# Patient Record
Sex: Female | Born: 1955 | Race: Black or African American | Hispanic: No | Marital: Single | Smoking: Former smoker
Health system: Southern US, Community
[De-identification: ages and names within clinical notes are randomized; demographics above are authoritative.]

## PROBLEM LIST (undated history)

## (undated) DIAGNOSIS — M1990 Unspecified osteoarthritis, unspecified site: Secondary | ICD-10-CM

---

## 1983-08-12 HISTORY — PX: CYST EXCISION: SHX5701

## 1998-08-11 HISTORY — PX: DILATION AND CURETTAGE OF UTERUS: SHX78

## 1998-08-11 HISTORY — PX: TUBAL LIGATION: SHX77

## 1999-09-19 ENCOUNTER — Ambulatory Visit (HOSPITAL_COMMUNITY): Admission: RE | Admit: 1999-09-19 | Discharge: 1999-09-19 | Payer: Self-pay | Admitting: *Deleted

## 2002-12-01 ENCOUNTER — Encounter: Payer: Self-pay | Admitting: Emergency Medicine

## 2002-12-01 ENCOUNTER — Emergency Department (HOSPITAL_COMMUNITY): Admission: EM | Admit: 2002-12-01 | Discharge: 2002-12-01 | Payer: Self-pay | Admitting: Emergency Medicine

## 2003-01-30 ENCOUNTER — Encounter: Payer: Self-pay | Admitting: Obstetrics

## 2003-01-30 ENCOUNTER — Ambulatory Visit (HOSPITAL_COMMUNITY): Admission: RE | Admit: 2003-01-30 | Discharge: 2003-01-30 | Payer: Self-pay | Admitting: Obstetrics

## 2003-03-25 ENCOUNTER — Emergency Department (HOSPITAL_COMMUNITY): Admission: EM | Admit: 2003-03-25 | Discharge: 2003-03-25 | Payer: Self-pay | Admitting: Emergency Medicine

## 2005-06-27 ENCOUNTER — Ambulatory Visit (HOSPITAL_COMMUNITY): Admission: RE | Admit: 2005-06-27 | Discharge: 2005-06-27 | Payer: Self-pay | Admitting: Obstetrics

## 2005-07-02 ENCOUNTER — Encounter (INDEPENDENT_AMBULATORY_CARE_PROVIDER_SITE_OTHER): Payer: Self-pay | Admitting: Specialist

## 2005-07-02 ENCOUNTER — Ambulatory Visit (HOSPITAL_COMMUNITY): Admission: RE | Admit: 2005-07-02 | Discharge: 2005-07-02 | Payer: Self-pay | Admitting: Obstetrics

## 2006-12-13 ENCOUNTER — Emergency Department (HOSPITAL_COMMUNITY): Admission: EM | Admit: 2006-12-13 | Discharge: 2006-12-13 | Payer: Self-pay | Admitting: Emergency Medicine

## 2010-03-04 ENCOUNTER — Emergency Department (HOSPITAL_COMMUNITY): Admission: EM | Admit: 2010-03-04 | Discharge: 2010-03-04 | Payer: Self-pay | Admitting: Emergency Medicine

## 2010-08-31 ENCOUNTER — Encounter: Payer: Self-pay | Admitting: Obstetrics

## 2010-09-01 ENCOUNTER — Encounter: Payer: Self-pay | Admitting: Obstetrics

## 2010-12-27 NOTE — H&P (Signed)
[facility]  Patient:    Laura Huff, Laura Huff                         MRN: [id number] Adm. Date:  [id number] Attending:  Deniece Ree                         History and Physical  HISTORY:                          The patient is a 55 year old multiparous female who desires permanent sterilization.  The patient indicates that she has studied this for several years now; however, is now ready for permanent sterilization procedure.  She understands the different types of contraceptives available. The procedure and its benefits were explained to the patient as well as answering all of her questions to her satisfaction.  PAST MEDICAL HISTORY, FAMILY HISTORY, REVIEW OF SYSTEMS:  Noncontributory.  PHYSICAL EXAMINATION:  GENERAL:                          A well-developed, well-nourished female in no  acute distress.  HEENT:                            Within normal limits.  NECK:                             Supple.  BREASTS:                          Without masses, tenderness, or discharge.  LUNGS:                            Clear to percussion and auscultation.  HEART:                            Normal sinus rhythm without murmur, rub, or gallop.  ABDOMEN:                          Benign.  EXTREMITIES:                      Within normal limits.  NEUROLOGIC:                       Within normal limits.  PELVIC:                           External genitalia and BUS within normal limits. The vagina is clear.  Cervix firm and nontender.  The uterus is normal size, shape, and consistency.  The adnexa is benign.  DIAGNOSIS:                        Multiparity desirous of permanent sterilization.  PLAN:                             Laparoscopic bilateral tubular cauterization ith tubal resection. DD:  09/19/99 TD:  09/19/99 Job: [id number]

## 2010-12-27 NOTE — Op Note (Signed)
Laura Huff, Laura Huff                  ACCOUNT NO.:  [id number]   MEDICAL RECORD NO.:  [id number]          PATIENT TYPE:  AMB   LOCATION:  SDC                           FACILITY:  [facility]   PHYSICIAN:  Kathreen Cosier, M.D.DATE OF BIRTH:  1955/12/13   DATE OF PROCEDURE:  07/02/2005  DATE OF DISCHARGE:                                 OPERATIVE REPORT   PREOPERATIVE DIAGNOSIS:  DOB.   ANESTHESIA:  General.   Under general anesthesia, the patient was placed in lithotomy position, the  perineum and vagina prepped and draped, and bladder emptied with a straight  catheter.  Bimanual exam revealed the uterus to be small, negative adnexa.  Speculum placed in the vagina.  Cervix injected with 9 mL of 1% Xylocaine at  3, 9, and 12 o'clock.  The anterior lip of the cervix was grasped with a  tenaculum.  The endocervix curetted.  A small amount of tissue obtained.  Endometrial cavity sounded to 8 cm.  The cervix dilated with #27 Shawnie Pons and  then the hysteroscope inserted.  She was noted to have small polyps and  otherwise the cavity was normal.  Procedure terminated and a sharp curettage  performed.  Small amount of tissue obtained.  Polyp forceps inserted and no  tissue obtained.  The hysteroscope was reinserted and the cavity was noted  to be clean.  The patient tolerated the procedure well.  Fluid deficit was  10 mL.           ______________________________  Kathreen Cosier, M.D.     BAM/MEDQ  D:  07/02/2005  T:  07/02/2005  Job:  [id number]

## 2010-12-27 NOTE — Op Note (Signed)
[facility]  Patient:    Laura Huff, Laura Huff                         MRN: [id number] Proc. Date: 09/19/99 Adm. Date:  [id number] Attending:  Deniece Ree                           Operative Report  PREOPERATIVE DIAGNOSIS:  Multiparity, desire for permanent sterilization.  POSTOPERATIVE DIAGNOSIS:  Multiparity, desire for permanent sterilization.  OPERATION:  Laparoscopic bilateral tubal cauterization with tubal resection.  SURGEON:  Deniece Ree, M.D.  ANESTHESIA:  General.  ESTIMATED BLOOD LOSS:  Less than 25 cc.  COMPLICATIONS:  None.  The patient tolerated the procedure well and returned to the recovery room in satisfactory condition.  DESCRIPTION OF PROCEDURE:  The patient was taken to the operating room and prepped and draped in the usual fashion for laparoscopic surgery.  Speculum was placed n the vagina following which the anterior lip of the cervix was then grasped with a Christella Hartigan tenaculum.  The acorn uterine manipulating cannula was then put into place. A subumbilical incision was then made following which the Veress needle was introduced through this incision and approximately 3L of carbon dioxide was then infused without any difficulty.  The laparoscopic trocar was placed through this incision following which the laparoscope was placed through a sleeve. Visualization of the pelvic organ came into view.  Uterus, tubes and ovaries all appeared to be within normal limits.  The right tube was then grasped approximately 5 mm proximal to the right cornu and cauterized approximately 3 cm in length. This was done likewise on the left side.  After this was completed in the cauterized  areas of both tubes, they were cut into two locations.  Hemostasis remained present at this point.  The sponge and needle count was correct x 2.  At this point the  carbon dioxide was then allowed to escape which it did so without any problems.  The  incision was then closed first with a deep interrupted stitch followed by a  subcuticular stitch using 4-0 Vicryl.  Procedure terminated.  The patient tolerated the procedure well and returned to the recovery room in satisfactory condition.  The patient is to be discharged when fully alert.  She has been instructed on the possible complications that can follow this type of surgery.  She has been told to return to my office in four weeks for follow-up evaluation or to call me prior o that time should any problem arise. DD:  09/19/99 TD:  09/20/99 Job: [id number]

## 2010-12-27 NOTE — Consult Note (Signed)
NAME:  Laura Huff, Laura Huff                            ACCOUNT NO.:  [id number]   MEDICAL RECORD NO.:  [id number]                   PATIENT TYPE:  EMS   LOCATION:  MINO                                 FACILITY:  [facility]   PHYSICIAN:  Peter M. Swaziland, M.D.               DATE OF BIRTH:  [birth year]   DATE OF CONSULTATION:  12/01/2002  DATE OF DISCHARGE:                                   CONSULTATION   HISTORY OF PRESENT ILLNESS:  The patient is a pleasant 55 year old African-  American female, previous healthy, who presents with complaints of chest  pain and left arm numbness. The patient states she has been having  intermittent right precordial chest pain for the past 10 days.  It is  localized pain described as a twisting sensation or a stabbing pain like an  ice pick.  There is no relation to movement activity or off.  It typically  lasts 5 to 10 minutes but may occur several times a day.  It does seem to be  worse after she has been lifting weights.  Today she developed some left arm  numbness and became concerned so decided to come to the emergency room.   Currently, she is pain free.  Of note, she did start on an exercise program  in January.  Over the past three to four weeks, she started working on  [employer].  She has no history of hypertension, diabetes, or  hypercholesterolemia, and she is a nonsmoker.   PAST MEDICAL HISTORY:  Notable for osteoarthritis of the right hip.  She is  followed by Dr. Ethelene Hal.   ALLERGIES:  No known allergies.   MEDICATIONS:  Vicodin p.r.n. and Vioxx.   SOCIAL HISTORY:  The patient works as a Engineer, civil (consulting) for [employer].  She is married and has two children.  She denies tobacco or alcohol use.   FAMILY HISTORY:  Father had three myocardial infarctions and coronary artery  bypass surgery prior to dying.  One brother has hypertension and a bad  heart.   REVIEW OF SYSTEMS:  Otherwise negative.   PHYSICAL EXAMINATION:  GENERAL:  The patient  is a pleasant, young black  female in no apparent distress.  VITAL SIGNS:  Blood pressure 127/61, pulse 54 and regular.  She is afebrile.  HEENT:  Exam is unremarkable.  Pupils are equal, round, and reactive to  light and accommodation.  Extraocular movements are full.  Oropharynx is  clear.  NECK:  Supple without JVD, adenopathy, thyromegaly, or bruit.  LUNGS:  Clear.  CARDIAC:  Regular rate and rhythm without murmurs, rubs, gallops, or clicks.  There is some mild right pectoral pain to palpation.  ABDOMEN:  Soft, nontender.  Bowel sounds are positive.  EXTREMITIES:  Without edema.  Pulses are 2+ and symmetric.  NEUROLOGIC:  Nonfocal.   LABORATORY DATA:  ECG shows marked sinus bradycardia, otherwise normal.  Chest x-ray shows no active disease.   Sodium 136, potassium 3.9, chloride 103, CO2 27, BUN 16, glucose 106.  Hemoglobin 16.  CK 105 with 1 MB.  Troponin is 0.01.   IMPRESSION:  1. Atypical chest pain most consistent with musculoskeletal pain.  2. Family history of coronary disease.   PLAN:  Will discharge home from the emergency room.  Will avoid  weightlifting and recommend nonsteroidal anti-inflammatory therapy. Will  arrange a followup stress echocardiogram as an outpatient tomorrow.                                               Peter M. Swaziland, M.D.    PMJ/MEDQ  D:  12/01/2002  T:  12/03/2002  Job:  [id number]   cc:   Renaye Rakers, M.D.  [phone]  [place]  [place] [address]  Fax: [phone]

## 2011-03-26 ENCOUNTER — Inpatient Hospital Stay (INDEPENDENT_AMBULATORY_CARE_PROVIDER_SITE_OTHER)
Admission: RE | Admit: 2011-03-26 | Discharge: 2011-03-26 | Disposition: A | Discharge status: Home / Self Care | Payer: Self-pay | Source: Ambulatory Visit | Attending: Family Medicine | Admitting: Family Medicine

## 2011-03-26 DIAGNOSIS — K089 Disorder of teeth and supporting structures, unspecified: Secondary | ICD-10-CM

## 2011-04-22 ENCOUNTER — Encounter (HOSPITAL_COMMUNITY): Payer: Self-pay | Admitting: Radiology

## 2011-04-22 ENCOUNTER — Emergency Department (HOSPITAL_COMMUNITY)
Admission: EM | Admit: 2011-04-22 | Discharge: 2011-04-22 | Disposition: A | Discharge status: Home / Self Care | Payer: Self-pay | Attending: Emergency Medicine | Admitting: Emergency Medicine

## 2011-04-22 ENCOUNTER — Emergency Department (HOSPITAL_COMMUNITY): Payer: Self-pay

## 2011-04-22 DIAGNOSIS — R42 Dizziness and giddiness: Secondary | ICD-10-CM | POA: Insufficient documentation

## 2011-04-22 DIAGNOSIS — I498 Other specified cardiac arrhythmias: Secondary | ICD-10-CM | POA: Insufficient documentation

## 2011-04-22 DIAGNOSIS — H539 Unspecified visual disturbance: Secondary | ICD-10-CM | POA: Insufficient documentation

## 2011-04-22 DIAGNOSIS — R5381 Other malaise: Secondary | ICD-10-CM | POA: Insufficient documentation

## 2011-04-22 DIAGNOSIS — R5383 Other fatigue: Secondary | ICD-10-CM | POA: Insufficient documentation

## 2011-04-22 LAB — POCT I-STAT, CHEM 8
BUN: 7 mg/dL (ref 6–23)
Calcium, Ion: 1.24 mmol/L (ref 1.12–1.32)
Creatinine, Ser: 0.8 mg/dL (ref 0.50–1.10)
Glucose, Bld: 85 mg/dL (ref 70–99)
HCT: 42 % (ref 36.0–46.0)
Hemoglobin: 14.3 g/dL (ref 12.0–15.0)
Potassium: 4.7 mEq/L (ref 3.5–5.1)
Sodium: 140 mEq/L (ref 135–145)
TCO2: 29 mmol/L (ref 0–100)

## 2011-04-22 LAB — CBC
HCT: 38.2 % (ref 36.0–46.0)
MCH: 31.8 pg (ref 26.0–34.0)
MCHC: 33.8 g/dL (ref 30.0–36.0)
MCV: 94.1 fL (ref 78.0–100.0)
Platelets: 213 10*3/uL (ref 150–400)
RBC: 4.06 MIL/uL (ref 3.87–5.11)
RDW: 12.5 % (ref 11.5–15.5)
WBC: 3.7 10*3/uL — ABNORMAL LOW (ref 4.0–10.5)

## 2011-04-22 LAB — DIFFERENTIAL
Basophils Absolute: 0 10*3/uL (ref 0.0–0.1)
Basophils Relative: 1 % (ref 0–1)
Eosinophils Relative: 1 % (ref 0–5)
Lymphocytes Relative: 47 % — ABNORMAL HIGH (ref 12–46)
Lymphs Abs: 1.7 10*3/uL (ref 0.7–4.0)
Monocytes Absolute: 0.5 10*3/uL (ref 0.1–1.0)
Monocytes Relative: 14 % — ABNORMAL HIGH (ref 3–12)
Neutro Abs: 1.4 10*3/uL — ABNORMAL LOW (ref 1.7–7.7)
Neutrophils Relative %: 38 % — ABNORMAL LOW (ref 43–77)

## 2012-03-28 ENCOUNTER — Emergency Department (HOSPITAL_COMMUNITY)
Admission: EM | Admit: 2012-03-28 | Discharge: 2012-03-28 | Disposition: A | Discharge status: Home / Self Care | Payer: Self-pay | Source: Home / Self Care

## 2012-03-28 ENCOUNTER — Encounter (HOSPITAL_COMMUNITY): Payer: Self-pay

## 2012-03-28 DIAGNOSIS — J029 Acute pharyngitis, unspecified: Secondary | ICD-10-CM

## 2012-03-28 DIAGNOSIS — J069 Acute upper respiratory infection, unspecified: Secondary | ICD-10-CM

## 2012-03-28 LAB — POCT RAPID STREP A: Streptococcus, Group A Screen (Direct): NEGATIVE

## 2012-03-28 MED ORDER — CETIRIZINE-PSEUDOEPHEDRINE ER 5-120 MG PO TB12: 1.0000 | ORAL_TABLET | Freq: Two times a day (BID) | ORAL | Status: AC

## 2012-03-28 MED ORDER — GUAIFENESIN 100 MG/5ML PO LIQD: 100.0000 mg | ORAL | Status: AC | PRN

## 2012-03-28 MED ORDER — SALINE NASAL SPRAY 0.65 % NA SOLN: 1.0000 | NASAL | Status: DC | PRN

## 2012-03-28 NOTE — ED Notes (Signed)
Pt has sorethroat that started one week ago and worse at hs.

## 2012-03-28 NOTE — ED Provider Notes (Signed)
History     CSN: [id number]  Arrival date & time 03/28/12  1840   None     Chief Complaint  Patient presents with  . Sore Throat    (Consider location/radiation/quality/duration/timing/severity/associated sxs/prior treatment) The history is provided by the patient.  Laura Huff is a 56 y.o. female who complains of onset of sore throat and cough after sleeping under air conditioner one week ago. States symptoms are persistent and not improved.  Has not taken medication at home for same.  States concerned because she has a history of strep throat twenty years ago.   + sore throat + cough, non productive No pleuritic pain No wheezing + nasal congestion + post-nasal drainage No sinus pain/pressure + voice changes No chest congestion No itchy/red eyes No earache No hemoptysis No SOB + chills/sweats No fever No nausea No vomiting No abdominal pain No diarrhea No skin rashes No fatigue No myalgias No headache  No ill contacts   History reviewed. No pertinent past medical history.  History reviewed. No pertinent past surgical history.  History reviewed. No pertinent family history.  History  Substance Use Topics  . Smoking status: Never Smoker   . Smokeless tobacco: Not on file  . Alcohol Use: Yes    OB History    Grav Para Term Preterm Abortions TAB SAB Ect Mult Living                  Review of Systems  All other systems reviewed and are negative.    Allergies  Codeine and Shellfish allergy  Home Medications   Current Outpatient Rx  Name Route Sig Dispense Refill  . HYDROCODONE-ACETAMINOPHEN 10-325 MG PO TABS Oral Take 1 tablet by mouth every 6 (six) hours as needed.    Marland Kitchen CETIRIZINE-PSEUDOEPHEDRINE ER 5-120 MG PO TB12 Oral Take 1 tablet by mouth 2 (two) times daily. 30 tablet 0  . GUAIFENESIN 100 MG/5ML PO LIQD Oral Take 5-10 mLs (100-200 mg total) by mouth every 4 (four) hours as needed for cough. 60 mL 0  . SALINE NASAL SPRAY 0.65 % NA SOLN Nasal  Place 1 spray into the nose as needed for congestion. 30 mL 12    BP 143/77  Pulse 70  Temp 98.3 F (36.8 C) (Oral)  Resp 18  SpO2 100%  Physical Exam  Nursing note and vitals reviewed. Constitutional: She is oriented to person, place, and time. Vital signs are normal. She appears well-developed and well-nourished. She is active and cooperative.  HENT:  Head: Normocephalic.  Right Ear: External ear normal.  Left Ear: External ear normal.  Nose: Nose normal.  Mouth/Throat: Oropharynx is clear and moist. No oropharyngeal exudate.       Post nasal gtt noted  Eyes: Conjunctivae are normal. Pupils are equal, round, and reactive to light. No scleral icterus.  Neck: Trachea normal. Neck supple.  Cardiovascular: Normal rate, regular rhythm and normal heart sounds.   Pulmonary/Chest: Effort normal and breath sounds normal. No respiratory distress. She has no wheezes. She exhibits no tenderness.  Lymphadenopathy:    She has no cervical adenopathy.  Neurological: She is alert and oriented to person, place, and time. No cranial nerve deficit or sensory deficit.  Skin: Skin is warm and dry.  Psychiatric: She has a normal mood and affect. Her speech is normal and behavior is normal. Judgment and thought content normal. Cognition and memory are normal.    ED Course  Procedures (including critical care time)   Labs Reviewed  POCT RAPID STREP A ([facility]ARE ONLY)   No results found.   1. Pharyngitis   2. URI (upper respiratory infection)       MDM  Increase fluid intake, rest.  No antibiotic indicated.  Begin Mucinex, saline nasal spray and/or saline irrigation, and cough suppressant at bedtime. Antihistamines of your choice (Claritin or Zyrtec).  Tylenol or Motrin for fever/discomfort.  Followup with PCP if not improving 7 to 10 days.        Johnsie Kindred, NP 03/28/12 2007

## 2012-03-29 NOTE — ED Provider Notes (Signed)
Medical screening examination/treatment/procedure(s) were performed by non-physician practitioner and as supervising physician I was immediately available for consultation/collaboration.   MORENO-COLL,Ka Flammer; MD   Quorra Rosene Moreno-Coll, MD 03/29/12 1213 

## 2012-12-21 ENCOUNTER — Other Ambulatory Visit: Payer: Self-pay | Admitting: Pain Medicine

## 2012-12-21 DIAGNOSIS — M549 Dorsalgia, unspecified: Secondary | ICD-10-CM

## 2012-12-25 ENCOUNTER — Inpatient Hospital Stay: Admission: RE | Admit: 2012-12-25 | Payer: Self-pay | Source: Ambulatory Visit

## 2013-01-08 ENCOUNTER — Other Ambulatory Visit (HOSPITAL_COMMUNITY): Payer: Self-pay | Admitting: Pain Medicine

## 2013-01-08 ENCOUNTER — Ambulatory Visit (HOSPITAL_COMMUNITY)
Admission: RE | Admit: 2013-01-08 | Discharge: 2013-01-08 | Disposition: A | Discharge status: Home / Self Care | Payer: BC Managed Care – PPO | Source: Ambulatory Visit | Attending: Pain Medicine | Admitting: Pain Medicine

## 2013-01-08 ENCOUNTER — Ambulatory Visit
Admission: RE | Admit: 2013-01-08 | Discharge: 2013-01-08 | Disposition: A | Discharge status: Home / Self Care | Payer: BC Managed Care – PPO | Source: Ambulatory Visit | Attending: Pain Medicine | Admitting: Pain Medicine

## 2013-01-08 DIAGNOSIS — M549 Dorsalgia, unspecified: Secondary | ICD-10-CM

## 2013-01-08 DIAGNOSIS — M25552 Pain in left hip: Secondary | ICD-10-CM

## 2013-01-08 DIAGNOSIS — M545 Low back pain, unspecified: Secondary | ICD-10-CM | POA: Insufficient documentation

## 2013-01-08 DIAGNOSIS — M25559 Pain in unspecified hip: Secondary | ICD-10-CM | POA: Insufficient documentation

## 2013-01-08 DIAGNOSIS — M25551 Pain in right hip: Secondary | ICD-10-CM

## 2014-02-01 ENCOUNTER — Emergency Department (INDEPENDENT_AMBULATORY_CARE_PROVIDER_SITE_OTHER)
Admission: EM | Admit: 2014-02-01 | Discharge: 2014-02-01 | Disposition: A | Discharge status: Home / Self Care | Payer: PRIVATE HEALTH INSURANCE | Source: Home / Self Care | Attending: Emergency Medicine | Admitting: Emergency Medicine

## 2014-02-01 ENCOUNTER — Encounter (HOSPITAL_COMMUNITY): Payer: Self-pay | Admitting: Emergency Medicine

## 2014-02-01 DIAGNOSIS — M545 Low back pain, unspecified: Secondary | ICD-10-CM

## 2014-02-01 HISTORY — DX: Unspecified osteoarthritis, unspecified site: M19.90

## 2014-02-01 MED ORDER — HYDROCODONE-ACETAMINOPHEN 10-325 MG PO TABS: 1.0000 | ORAL_TABLET | Freq: Four times a day (QID) | ORAL | Status: DC | PRN

## 2014-02-01 NOTE — ED Notes (Addendum)
C/o low back pain and bil. hip pain started yesterday.  Has chronic pain from arthritis.  She goes to pain clinic. Her insurance changed and her new insurance did not cover the pain doctor she was seeing.  She is in the process of getting a new pain management doctor- Dr. Odette FractionPaul Harkins.  Her PCP referred her to Dr. Ollen BowlHarkins.  Has appt. @ 7/21.  Has run out of her pain medicine.  Her PCP would not prescribe it without seeing her.  No appt. available with PCP for 2 weeks. Requests refill of Hydrocodone.

## 2014-02-01 NOTE — ED Provider Notes (Signed)
CSN: [id number]     Arrival date & time 02/01/14  1716 History   First MD Initiated Contact with Patient 02/01/14 1826     Chief Complaint  Patient presents with  . Back Pain  . Hip Pain   (Consider location/radiation/quality/duration/timing/severity/associated sxs/prior Treatment) Patient is a 58 y.o. female presenting with back pain and hip pain. The history is provided by the patient. No language interpreter was used.  Back Pain Location:  Generalized Quality:  Aching Radiates to:  R thigh Pain severity:  Moderate Pain is:  Same all the time Onset quality:  Gradual Timing:  Constant Progression:  Unchanged Chronicity:  New Relieved by:  Nothing Worsened by:  Nothing tried Ineffective treatments:  None tried Hip Pain    Past Medical History  Diagnosis Date  . Arthritis    Past Surgical History  Procedure Laterality Date  . Tubal ligation  2000  . Cyst excision Right 1985    thought it was a cyst but it was a lymph gland-R neck  . Dilation and curettage of uterus  2000   Family History  Problem Relation Age of Onset  . Cancer Mother     cervical cancer  . Hypertension Father   . Heart failure Father    History  Substance Use Topics  . Smoking status: Never Smoker   . Smokeless tobacco: Not on file  . Alcohol Use: 0.6 oz/week    1 Cans of beer per week   OB History   Grav Para Term Preterm Abortions TAB SAB Ect Mult Living                 Review of Systems  Musculoskeletal: Positive for back pain.  All other systems reviewed and are negative.   Allergies  Shellfish allergy and Codeine  Home Medications   Prior to Admission medications   Medication Sig Start Date End Date Taking? Authorizing Provider  HYDROcodone-acetaminophen (NORCO) 10-325 MG per tablet Take 1 tablet by mouth every 6 (six) hours as needed.   Yes Historical Provider, MD  sodium chloride (OCEAN NASAL SPRAY) 0.65 % nasal spray Place 1 spray into the nose as needed for congestion.  03/28/12 03/28/13  Johnsie Kindredarmen L Chatten, NP   BP 137/84  Pulse 91  Temp(Src) 99.1 F (37.3 C) (Oral)  Resp 18  SpO2 99% Physical Exam  Nursing note and vitals reviewed. Constitutional: She is oriented to person, place, and time. She appears well-developed and well-nourished.  HENT:  Head: Normocephalic.  Eyes: EOM are normal. Pupils are equal, round, and reactive to light.  Neck: Normal range of motion.  Pulmonary/Chest: Effort normal.  Abdominal: She exhibits no distension.  Musculoskeletal:  Tender ls spine tender right hip  Neurological: She is alert and oriented to person, place, and time.  Psychiatric: She has a normal mood and affect.    ED Course  Procedures (including critical care time) Labs Review Labs Reviewed - No data to display  Imaging Review No results found.   MDM   1. Midline low back pain without sciatica    Pt has chronic pain.  Pt's insurance changed and her pain doctor will no longer see her.   Pt has appointment with a new doctor in July  i advised pt I will give 3 day supply.  We can not and will not manage pain medications at urgent care.   Elson AreasLeslie K Sofia, PA-C 02/01/14 1926  Lonia SkinnerLeslie K PeckhamSofia, PA-C 02/01/14 1928

## 2014-02-01 NOTE — Discharge Instructions (Signed)

## 2014-02-02 NOTE — ED Provider Notes (Signed)
Medical screening examination/treatment/procedure(s) were performed by a resident physician or non-physician practitioner and as the supervising physician I was immediately available for consultation/collaboration.  Evan Corey, MD    Evan S Corey, MD 02/02/14 0728 

## 2014-02-21 ENCOUNTER — Emergency Department (INDEPENDENT_AMBULATORY_CARE_PROVIDER_SITE_OTHER)
Admission: EM | Admit: 2014-02-21 | Discharge: 2014-02-21 | Disposition: A | Discharge status: Home / Self Care | Payer: PRIVATE HEALTH INSURANCE | Source: Home / Self Care | Attending: Emergency Medicine | Admitting: Emergency Medicine

## 2014-02-21 ENCOUNTER — Encounter (HOSPITAL_COMMUNITY): Payer: Self-pay | Admitting: Emergency Medicine

## 2014-02-21 DIAGNOSIS — M160 Bilateral primary osteoarthritis of hip: Secondary | ICD-10-CM

## 2014-02-21 DIAGNOSIS — F411 Generalized anxiety disorder: Secondary | ICD-10-CM

## 2014-02-21 DIAGNOSIS — F419 Anxiety disorder, unspecified: Secondary | ICD-10-CM

## 2014-02-21 DIAGNOSIS — M1610 Unilateral primary osteoarthritis, unspecified hip: Secondary | ICD-10-CM

## 2014-02-21 MED ORDER — CLONAZEPAM 2 MG PO TABS: 2.0000 mg | ORAL_TABLET | Freq: Two times a day (BID) | ORAL | Status: DC

## 2014-02-21 NOTE — ED Provider Notes (Signed)
  Chief Complaint    Chief Complaint  Patient presents with  . 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 [facility] and was referred to pain management. She was on very high dose of Vicodin per pain management. She was taking 2 of the 10/325 tablets 4 times a day. Then her insurance changed and she could no longer be seen by pain management or her primary care Dr. Geronimo RunningShe's been without the Vicodin for about 3 weeks. She stopped cold [place]. She went through withdrawal symptoms. She did not want to start it back again. Her pain is now manageable with ibuprofen. She was also getting clonazepam 2 mg at bedtime as needed basis. She would like to get this refilled. She still somewhat nervous and jittery, having gone through withdrawal. She denies any depression, homicidal or suicidal ideation, or use of alcohol or recreational drugs.  Review of Systems     Other than as noted above, the patient denies any of the following symptoms: Systemic:  No fevers or chills.   Musculoskeletal:  No joint pain, arthritis, back pain, or neck pain. Neurological:  No muscular weakness or paresthesias.  PMFSH    Past medical history, family history, social history, meds, and allergies were reviewed.    Physical Exam    Vital signs:  BP 175/76  Pulse 64  Temp(Src) 98.7 F (37.1 C) (Oral)  Resp 16  SpO2 100% Gen:  Alert and oriented times 3.  In no distress. Musculoskeletal: Exam of the hips reveals mild pain to palpation laterally and in the groin areas bilaterally. Both hips have a diminished range of motion with pain on movement. Straight leg raising was negative.  Otherwise, all joints had a full a ROM with no swelling, bruising or deformity.  No edema, pulses full. Extremities were warm and pink.  Capillary refill was brisk.  Skin:   Clear, warm and dry.  No rash. Neuro:  Alert and oriented times 3.  Muscle strength was normal.  Sensation was intact to light touch.   Assessment    The primary encounter diagnosis was Primary osteoarthritis of both hips. A diagnosis of Anxiety was also pertinent to this visit.  Plan   1.  Meds:  The following meds were prescribed:   Discharge Medication List as of 02/21/2014  7:56 PM    START taking these medications   Details  clonazePAM (KLONOPIN) 2 MG tablet Take 1 tablet (2 mg total) by mouth 2 (two) times daily., Starting 02/21/2014, Until Discontinued, Print        2.  Patient Education/Counseling:  The patient was given appropriate handouts, self care instructions, and instructed in symptomatic relief, including rest and activity, elevation, application of ice and compression.    3.  Follow up:  The patient was told to follow up here if no better in 3 to 4 days, or sooner if becoming worse in any way, and given some red flag symptoms such as worsening pain or new neurological symptoms which would prompt immediate return.  Follow up here as needed.     Reuben Likesavid C Devani Odonnel, MD 02/21/14 [phone]

## 2014-02-21 NOTE — ED Notes (Signed)
C/o anxiety States her nerves are bad, she can't sleep at night and she is restless  Would like a refill on clonazepam

## 2014-02-21 NOTE — Discharge Instructions (Signed)
Arthritis, Nonspecific °Arthritis is inflammation of a joint. This usually means pain, redness, warmth or swelling are present. One or more joints may be involved. There are a number of types of arthritis. Your caregiver may not be able to tell what type of arthritis you have right away. °CAUSES  °The most common cause of arthritis is the wear and tear on the joint (osteoarthritis). This causes damage to the cartilage, which can break down over time. The knees, hips, back and neck are most often affected by this type of arthritis. °Other types of arthritis and common causes of joint pain include: °· Sprains and other injuries near the joint. Sometimes minor sprains and injuries cause pain and swelling that develop hours later. °· Rheumatoid arthritis. This affects hands, feet and knees. It usually affects both sides of your body at the same time. It is often associated with chronic ailments, fever, weight loss and general weakness. °· Crystal arthritis. Gout and pseudo gout can cause occasional acute severe pain, redness and swelling in the foot, ankle, or knee. °· Infectious arthritis. Bacteria can get into a joint through a break in overlying skin. This can cause infection of the joint. Bacteria and viruses can also spread through the blood and affect your joints. °· Drug, infectious and allergy reactions. Sometimes joints can become mildly painful and slightly swollen with these types of illnesses. °SYMPTOMS  °· Pain is the main symptom. °· Your joint or joints can also be red, swollen and warm or hot to the touch. °· You may have a fever with certain types of arthritis, or even feel overall ill. °· The joint with arthritis will hurt with movement. Stiffness is present with some types of arthritis. °DIAGNOSIS  °Your caregiver will suspect arthritis based on your description of your symptoms and on your exam. Testing may be needed to find the type of arthritis: °· Blood and sometimes urine tests. °· X-ray tests  and sometimes CT or MRI scans. °· Removal of fluid from the joint (arthrocentesis) is done to check for bacteria, crystals or other causes. Your caregiver (or a specialist) will numb the area over the joint with a local anesthetic, and use a needle to remove joint fluid for examination. This procedure is only minimally uncomfortable. °· Even with these tests, your caregiver may not be able to tell what kind of arthritis you have. Consultation with a specialist (rheumatologist) may be helpful. °TREATMENT  °Your caregiver will discuss with you treatment specific to your type of arthritis. If the specific type cannot be determined, then the following general recommendations may apply. °Treatment of severe joint pain includes: °· Rest. °· Elevation. °· Anti-inflammatory medication (for example, ibuprofen) may be prescribed. Avoiding activities that cause increased pain. °· Only take over-the-counter or prescription medicines for pain and discomfort as recommended by your caregiver. °· Cold packs over an inflamed joint may be used for 10 to 15 minutes every hour. Hot packs sometimes feel better, but do not use overnight. Do not use hot packs if you are diabetic without your caregiver's permission. °· A cortisone shot into arthritic joints may help reduce pain and swelling. °· Any acute arthritis that gets worse over the next 1 to 2 days needs to be looked at to be sure there is no joint infection. °Long-term arthritis treatment involves modifying activities and lifestyle to reduce joint stress jarring. This can include weight loss. Also, exercise is needed to nourish the joint cartilage and remove waste. This helps keep the muscles   around the joint strong. °HOME CARE INSTRUCTIONS  °· Do not take aspirin to relieve pain if gout is suspected. This elevates uric acid levels. °· Only take over-the-counter or prescription medicines for pain, discomfort or fever as directed by your caregiver. °· Rest the joint as much as  possible. °· If your joint is swollen, keep it elevated. °· Use crutches if the painful joint is in your leg. °· Drinking plenty of fluids may help for certain types of arthritis. °· Follow your caregiver's dietary instructions. °· Try low-impact exercise such as: °¨ Swimming. °¨ Water aerobics. °¨ Biking. °¨ Walking. °· Morning stiffness is often relieved by a warm shower. °· Put your joints through regular range-of-motion. °SEEK MEDICAL CARE IF:  °· You do not feel better in 24 hours or are getting worse. °· You have side effects to medications, or are not getting better with treatment. °SEEK IMMEDIATE MEDICAL CARE IF:  °· You have a fever. °· You develop severe joint pain, swelling or redness. °· Many joints are involved and become painful and swollen. °· There is severe back pain and/or leg weakness. °· You have loss of bowel or bladder control. °Document Released: 09/04/2004 Document Revised: 10/20/2011 Document Reviewed: 09/20/2008 °ExitCare® Patient Information ©2015 [employer]. This information is not intended to replace advice given to you by your health care provider. Make sure you discuss any questions you have with your health care provider. ° °

## 2014-03-27 ENCOUNTER — Encounter: Payer: Self-pay | Admitting: Medical

## 2014-03-27 ENCOUNTER — Ambulatory Visit (INDEPENDENT_AMBULATORY_CARE_PROVIDER_SITE_OTHER): Payer: Self-pay | Admitting: Medical

## 2014-03-27 VITALS — BP 126/80 | HR 72 | Temp 90.0°F | Resp 16 | Ht 65.25 in | Wt 166.0 lb

## 2014-03-27 DIAGNOSIS — G4700 Insomnia, unspecified: Secondary | ICD-10-CM

## 2014-03-27 DIAGNOSIS — G8929 Other chronic pain: Secondary | ICD-10-CM

## 2014-03-27 DIAGNOSIS — F411 Generalized anxiety disorder: Secondary | ICD-10-CM

## 2014-03-27 DIAGNOSIS — M25559 Pain in unspecified hip: Secondary | ICD-10-CM

## 2014-03-27 DIAGNOSIS — M549 Dorsalgia, unspecified: Secondary | ICD-10-CM

## 2014-03-27 NOTE — Progress Notes (Signed)
Subjective:   Laura Huff is a 58 y.o. female presenting on 03/27/2014 with new pt  New patient today.  Ended up here due to insurance change.  She notes a hx/o hip pains and back pains for many years.  Has had steroid injection in hip, multiple in the past, has had lumbar facet injections in low back in the past [facility]  Was seeing Dr. Jordan Likes for the past year, priro to that was seeing Dr. Ethelene Hal for many years.   Had been getting 10/325mg  hydrocodone 2 tablets QID, #240 per month through pain clinic.  But after insurance change, can't see Dr. Cyndia Diver clinic at this point.  6 wk ago stopped the medication cold [place] for a week, had flu like symptoms.   After 4 weeks, started aching in both hips again. Doesn't feel she needs 8 tablets daily, but feels like she needs 3-4 hydrocodone daily for pain.   Works for [employer], but [employer].  Applied for [employer], bought a policy, but missed a payment and there was some discrepancy at the front office today regarding insurance.   So currently is not insured.    Recently been having anxiety problems , nervous, anxious, saw Dr. Lorenz Coaster, [facility].  Was advised to get appt with primary care.  In the past had gotten clonazepam for sleep from Dr. Ethelene Hal.     She denies prior back surgery, denies prior physical therapy referral, is not up-to-date with colonoscopy and mammogram and Pap, has never had a colonoscopy.  Regarding anxiety no prior treatment for diagnosis of this other than recent prescription for 2 mg clonazepam.  She is a Designer, jewellery.  Lives at home with husband.      No other aggravating or relieving factors.  No other complaint.  Review of Systems ROS as in subjective      Objective:    BP 126/80  Pulse 72  Temp(Src) 90 F (32.2 C) (Oral)  Resp 16  Ht 5' 5.25" (1.657 m)  Wt 166 lb (75.297 kg)  BMI 27.42 kg/m2  General appearance: alert, no distress, WD/WN,  AA female Neck: supple, no lymphadenopathy, no thyromegaly, no masses, nontender Heart: RRR, normal S1, S2, no murmurs Lungs: CTA bilaterally, no wheezes, rhonchi, or rales Abdomen: +bs, soft, non tender, non distended, no masses, no hepatomegaly, no splenomegaly Back: mild lumbar paraspinal and midline tennderss, mild pain with ROM which is relatively full Pulses: 2+ symmetric, upper and lower extremities, normal cap refill Neuro: Her leg strength seems to be 4-5 out of 5 worse on the left in general, DTRs 1-2+ bilateral lower extremity, normal heel and toe walk, straight leg raising is positive at 20 both legs, otherwise nonfocal exam Msk: Bilateral hip external and internal range of motion seems reduced and with pain, otherwise legs nontender normal range of motion      Assessment: Encounter Diagnoses  Name Primary?  . Chronic back pain Yes  . Chronic hip pain, unspecified laterality   . Generalized anxiety disorder   . Insomnia      Plan: We discussed her concerns, her history of chronic pains, prior medications. I did review the hip x-rays and lumbar MRI there in the chart in 2014.  I have no other records other than the pill bottles she brought in.  I advise that we are not a pain clinic nor do we routinely manage chronic narcotic use.   Unfortunately she  has had an insurance change which led her here.  I did offer other medications to at least give her some relief pending records she declines NSAID, Ultram, Cymbalta, or other medication at this time. She notes that only hydrocodone helps her pain and she was not willing to start an SSRI or other medication.  Advised that she would have to be willing and open to other treatment recommendations for me to work with her.  At this point we will request prior records from [facility] and Dr. Jordan LikesSpivey with the pain clinic. No medications were prescribed today.  She will likely need to check with her insurance  coverage which may actually be in effect and find out where we can refer for pain clinic.  [name] was seen today for new pt.  Diagnoses and associated orders for this visit:  Chronic back pain  Chronic hip pain, unspecified laterality  Generalized anxiety disorder  Insomnia    Return pending records.

## 2014-03-28 ENCOUNTER — Telehealth: Payer: Self-pay | Admitting: Medical

## 2014-03-28 NOTE — Telephone Encounter (Signed)
lm

## 2014-04-25 ENCOUNTER — Telehealth (HOSPITAL_COMMUNITY): Payer: Self-pay | Admitting: *Deleted

## 2014-04-25 NOTE — ED Notes (Signed)
Pt. called and asked if I would ask Dr. Lorenz Coaster for a refill of her Hydrocodone and Clonazepam.  She said she was seen at the [facility] clinic but they did not give her any medication because she does not have insurance.  I told her we are not primary care and do not do refills or chronic pain management.  I told her she can go to the [facility] and get signed up there.  They do primary care for pt.'s that don't have insurance. Laura Huff 04/25/2014

## 2014-04-28 ENCOUNTER — Encounter (HOSPITAL_COMMUNITY): Payer: Self-pay | Admitting: Emergency Medicine

## 2014-04-28 ENCOUNTER — Emergency Department (INDEPENDENT_AMBULATORY_CARE_PROVIDER_SITE_OTHER)
Admission: EM | Admit: 2014-04-28 | Discharge: 2014-04-28 | Disposition: A | Discharge status: Home / Self Care | Payer: Self-pay | Source: Home / Self Care | Attending: Family Medicine | Admitting: Family Medicine

## 2014-04-28 DIAGNOSIS — F411 Generalized anxiety disorder: Secondary | ICD-10-CM

## 2014-04-28 DIAGNOSIS — M150 Primary generalized (osteo)arthritis: Principal | ICD-10-CM

## 2014-04-28 DIAGNOSIS — M8949 Other hypertrophic osteoarthropathy, multiple sites: Secondary | ICD-10-CM

## 2014-04-28 DIAGNOSIS — M159 Polyosteoarthritis, unspecified: Secondary | ICD-10-CM

## 2014-04-28 DIAGNOSIS — F419 Anxiety disorder, unspecified: Secondary | ICD-10-CM

## 2014-04-28 MED ORDER — CLONAZEPAM 0.5 MG PO TABS: 0.5000 mg | ORAL_TABLET | Freq: Two times a day (BID) | ORAL | Status: DC | PRN

## 2014-04-28 MED ORDER — MELOXICAM 7.5 MG PO TABS: 7.5000 mg | ORAL_TABLET | Freq: Every day | ORAL | Status: DC

## 2014-04-28 MED ORDER — HYDROCODONE-ACETAMINOPHEN 5-325 MG PO TABS: 1.0000 | ORAL_TABLET | ORAL | Status: DC | PRN

## 2014-04-28 MED ORDER — TRAMADOL HCL 50 MG PO TABS: 50.0000 mg | ORAL_TABLET | Freq: Four times a day (QID) | ORAL | Status: DC | PRN

## 2014-04-28 NOTE — ED Notes (Signed)
Patient c/o bilateral hip pain worse on the left. This is a chronic problem. Patient reports she has been experiencing a lot of pain keeping her up at night. Patient states she does not have health insurance. Patient is alert and oriented and in no acute distress.

## 2014-04-28 NOTE — Discharge Instructions (Signed)
You will need to arrange follow up with a primary care MD,  your orthopedists and pain management physician for further management of your chronic pain and anxiety. Do not take Hydrocodone and Tramadol together.  Osteoarthritis Osteoarthritis is a disease that causes soreness and inflammation of a joint. It occurs when the cartilage at the affected joint wears down. Cartilage acts as a cushion, covering the ends of bones where they meet to form a joint. Osteoarthritis is the most common form of arthritis. It often occurs in older people. The joints affected most often by this condition include those in the:  Ends of the fingers.  Thumbs.  Neck.  Lower back.  Knees.  Hips. CAUSES  Over time, the cartilage that covers the ends of bones begins to wear away. This causes bone to rub on bone, producing pain and stiffness in the affected joints.  RISK FACTORS Certain factors can increase your chances of having osteoarthritis, including:  Older age.  Excessive body weight.  Overuse of joints.  Previous joint injury. SIGNS AND SYMPTOMS   Pain, swelling, and stiffness in the joint.  Over time, the joint may lose its normal shape.  Small deposits of bone (osteophytes) may grow on the edges of the joint.  Bits of bone or cartilage can break off and float inside the joint space. This may cause more pain and damage. DIAGNOSIS  Your health care provider will do a physical exam and ask about your symptoms. Various tests may be ordered, such as:  X-rays of the affected joint.  An MRI scan.  Blood tests to rule out other types of arthritis.  Joint fluid tests. This involves using a needle to draw fluid from the joint and examining the fluid under a microscope. TREATMENT  Goals of treatment are to control pain and improve joint function. Treatment plans may include:  A prescribed exercise program that allows for rest and joint relief.  A weight control plan.  Pain relief techniques,  such as:  Properly applied heat and cold.  Electric pulses delivered to nerve endings under the skin (transcutaneous electrical nerve stimulation [TENS]).  Massage.  Certain nutritional supplements.  Medicines to control pain, such as:  Acetaminophen.  Nonsteroidal anti-inflammatory drugs (NSAIDs), such as naproxen.  Narcotic or central-acting agents, such as tramadol.  Corticosteroids. These can be given orally or as an injection.  Surgery to reposition the bones and relieve pain (osteotomy) or to remove loose pieces of bone and cartilage. Joint replacement may be needed in advanced states of osteoarthritis. HOME CARE INSTRUCTIONS   Take medicines only as directed by your health care provider.  Maintain a healthy weight. Follow your health care provider's instructions for weight control. This may include dietary instructions.  Exercise as directed. Your health care provider can recommend specific types of exercise. These may include:  Strengthening exercises. These are done to strengthen the muscles that support joints affected by arthritis. They can be performed with weights or with exercise bands to add resistance.  Aerobic activities. These are exercises, such as brisk walking or low-impact aerobics, that get your heart pumping.  Range-of-motion activities. These keep your joints limber.  Balance and agility exercises. These help you maintain daily living skills.  Rest your affected joints as directed by your health care provider.  Keep all follow-up visits as directed by your health care provider. SEEK MEDICAL CARE IF:   Your skin turns red.  You develop a rash in addition to your joint pain.  You have  worsening joint pain.  You have a fever along with joint or muscle aches. SEEK IMMEDIATE MEDICAL CARE IF:  You have a significant loss of weight or appetite.  You have night sweats. FOR MORE INFORMATION   National Institute of Arthritis and Musculoskeletal  and Skin Diseases: www.niams.http://www.myers.net/  [employer] on Aging: https://walker.com/  American College of Rheumatology: www.rheumatology.org Document Released: 07/28/2005 Document Revised: 12/12/2013 Document Reviewed: 04/04/2013 [facility] Patient Information 2015 [place], [place]. This information is not intended to replace advice given to you by your health care provider. Make sure you discuss any questions you have with your health care provider.  Arthritis, Nonspecific Arthritis is pain, redness, warmth, or puffiness (inflammation) of a joint. The joint may be stiff or hurt when you move it. One or more joints may be affected. There are many types of arthritis. Your doctor may not know what type you have right away. The most common cause of arthritis is wear and tear on the joint (osteoarthritis). HOME CARE   Only take medicine as told by your doctor.  Rest the joint as much as possible.  Raise (elevate) your joint if it is puffy.  Use crutches if the painful joint is in your leg.  Drink enough fluids to keep your pee (urine) clear or pale yellow.  Follow your doctor's diet instructions.  Use cold packs for very bad joint pain for 10 to 15 minutes every hour. Ask your doctor if it is okay for you to use hot packs.  Exercise as told by your doctor.  Take a warm shower if you have stiffness in the morning.  Move your sore joints throughout the day. GET HELP RIGHT AWAY IF:   You have a fever.  You have very bad joint pain, puffiness, or redness.  You have many joints that are painful and puffy.  You are not getting better with treatment.  You have very bad back pain or leg weakness.  You cannot control when you poop (bowel movement) or pee (urinate).  You do not feel better in 24 hours or are getting worse.  You are having side effects from your medicine. MAKE SURE YOU:   Understand these instructions.  Will watch your condition.  Will get help right away if you are  not doing well or get worse. Document Released: 10/22/2009 Document Revised: 01/27/2012 Document Reviewed: 10/22/2009 [facility] Patient Information 2015 [place], [place]. This information is not intended to replace advice given to you by your health care provider. Make sure you discuss any questions you have with your health care provider.

## 2014-04-29 NOTE — ED Provider Notes (Signed)
Medical screening examination/treatment/procedure(s) were performed by resident physician or non-physician practitioner and as supervising physician I was immediately available for consultation/collaboration.   KINDL,JAMES DOUGLAS MD.   James D Kindl, MD 04/29/14 1047 

## 2014-04-29 NOTE — ED Provider Notes (Signed)
CSN: [id number]     Arrival date & time 04/28/14  1634 History   First MD Initiated Contact with Patient 04/28/14 1752     Chief Complaint  Patient presents with  . Hip Pain    Patient is a 58 y.o. female presenting with hip pain. The history is provided by the patient.  Hip Pain This is a chronic problem. The current episode started 2 days ago. The problem occurs constantly. The problem has been gradually worsening. Pertinent negatives include no chest pain, no abdominal pain, no headaches and no shortness of breath. The symptoms are aggravated by walking and bending.  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 She's been to [facility] orthopedics (Dr Ethelene Hal) and was referred to pain management (Dr Jordan Likes). She was on very high dose of Vicodin per pain management. She was taking 2 of the 10/325 tablets 4 times a day. She has also had multiple steroid injections as well. Then her insurance changed and she could no longer be seen by pain management or her primary care Dr. Geronimo Running been without the Vicodin since June. 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 She is also requesting a refill for her clonazepam that she takes for sleep and anxiety. She states she will enroll in [facility] in November and should be able to return to her PCP, ortho and pain management MD. She is requesting refills for these meds. She reports no new symptoms.   Past Medical History  Diagnosis Date  . Arthritis    Past Surgical History  Procedure Laterality Date  . Tubal ligation  2000  . Cyst excision Right 1985    thought it was a cyst but it was a lymph gland-R neck  . Dilation and curettage of uterus  2000   Family History    Problem Relation Age of Onset  . Cancer Mother     cervical cancer  . Hypertension Father   . Heart failure Father    History  Substance Use Topics  . Smoking status: Never Smoker   . Smokeless tobacco: Not on file  . Alcohol Use: 0.6 oz/week    1 Cans of beer per week   OB History   Grav Para Term Preterm Abortions TAB SAB Ect Mult Living                 Review of Systems  Respiratory: Negative for shortness of breath.   Cardiovascular: Negative for chest pain.  Gastrointestinal: Negative for abdominal pain.  Neurological: Negative for headaches.  All other systems reviewed and are negative.   Allergies  Shellfish allergy and Codeine  Home Medications   Prior to Admission medications   Medication Sig Start Date End Date Taking? Authorizing Provider  clonazePAM (KLONOPIN) 0.5 MG tablet Take 1 tablet (0.5 mg total) by mouth 2 (two) times daily as needed for anxiety. 04/28/14   Roma Kayser Schorr, NP  clonazePAM (KLONOPIN) 2 MG tablet Take 1 tablet (2 mg total) by mouth 2 (two) times daily. 02/21/14   Reuben Likes, MD  HYDROcodone-acetaminophen [facility]) 10-325 MG per tablet Take 2 tablets by mouth 4 (four) times daily.  02/01/14   Elson Areas, PA-C  HYDROcodone-acetaminophen (NORCO/VICODIN) 5-325 MG per tablet Take 1 tablet by mouth every 4 (four) hours as needed for moderate pain or severe pain. 04/28/14   Roma Kayser Schorr, NP  meloxicam (MOBIC) 7.5 MG tablet Take 1 tablet (7.5 mg total) by mouth daily. 04/28/14   Roma Kayser Schorr, NP  sodium chloride (OCEAN NASAL SPRAY) 0.65 % nasal spray Place 1 spray into the nose as needed for congestion. 03/28/12 03/28/13  Johnsie Kindred, NP  traMADol (ULTRAM) 50 MG tablet Take 1 tablet (50 mg total) by mouth every 6 (six) hours as needed for moderate pain or severe pain. 04/28/14   Roma Kayser Schorr, NP   BP 150/84  Pulse 98  Temp(Src) 99.2 F (37.3 C) (Oral)  Resp 12  SpO2 96% Physical Exam  Nursing note and vitals  reviewed. Constitutional: She is oriented to person, place, and time. She appears well-developed and well-nourished.  HENT:  Head: Normocephalic and atraumatic.  Eyes: Conjunctivae are normal.  Neck: Neck supple.  Cardiovascular: Normal rate.   Musculoskeletal:       Legs: Neurological: She is alert and oriented to person, place, and time. She has normal reflexes.  Skin: Skin is warm and dry.  Psychiatric: She has a normal mood and affect.    ED Course  Procedures (including critical care time) Labs Review Labs Reviewed - No data to display  Imaging Review No results found.   MDM   1. Primary osteoarthritis involving multiple joints   2. Anxiety    Pt w/ known h/o osteoarthritis and anxiety who has been under tx of PCP, ortho MD and pain  Management specialists until she lost insurance. Requesting refills for Klonopin and Hydrocodone. Reviewed the narcotic data-base and pt has not rec'd any sceduled medications since 01/2014. Discussed with pt that we can not continue to manage her chronic pain and anxiety issues as ideally this should be managed by a pain specialists and PCP respectively. Offered limited amounts of hydrocodone and Klonopin and added Tramadol and Mobic  as well. Pt verbalizes understanding and states she will make every effort to make these meds last until Nov.     Roma Kayser Schorr, NP 04/29/14 [phone]

## 2014-05-09 NOTE — ED Notes (Signed)
Pt. called and asked for refills of Klonazapam, Hydrocodone and Mobic.  I told her we are not primary care and don't do refills.  She has no insurance and Dr. Ethelene Halamos won't see her until she gets it. The open enrollment is in Nov.  I told her [facility] can [facility] [address] or come back here.

## 2014-05-11 ENCOUNTER — Emergency Department (INDEPENDENT_AMBULATORY_CARE_PROVIDER_SITE_OTHER)
Admission: EM | Admit: 2014-05-11 | Discharge: 2014-05-11 | Disposition: A | Discharge status: Home / Self Care | Payer: Self-pay | Source: Home / Self Care

## 2014-05-11 ENCOUNTER — Encounter (HOSPITAL_COMMUNITY): Payer: Self-pay | Admitting: Emergency Medicine

## 2014-05-11 DIAGNOSIS — H119 Unspecified disorder of conjunctiva: Secondary | ICD-10-CM

## 2014-05-11 DIAGNOSIS — G894 Chronic pain syndrome: Secondary | ICD-10-CM

## 2014-05-11 MED ORDER — TOBRAMYCIN 0.3 % OP SOLN: 1.0000 [drp] | Freq: Four times a day (QID) | OPHTHALMIC | Status: DC

## 2014-05-11 MED ORDER — IPRATROPIUM BROMIDE 0.06 % NA SOLN
2.0000 | Freq: Four times a day (QID) | NASAL | Status: DC
Start: 2014-05-11 — End: 2016-06-05

## 2014-05-11 NOTE — ED Provider Notes (Signed)
CSN: [id number]     Arrival date & time 05/11/14  1726 History   None    Chief Complaint  Patient presents with  . Conjunctivitis    left eye  . URI   (Consider location/radiation/quality/duration/timing/severity/associated sxs/prior Treatment) Patient is a 58 y.o. female presenting with conjunctivitis. The history is provided by the patient.  Conjunctivitis This is a new problem. The current episode started 2 days ago. The problem has not changed since onset.   Past Medical History  Diagnosis Date  . Arthritis    Past Surgical History  Procedure Laterality Date  . Tubal ligation  2000  . Cyst excision Right 1985    thought it was a cyst but it was a lymph gland-R neck  . Dilation and curettage of uterus  2000   Family History  Problem Relation Age of Onset  . Cancer Mother     cervical cancer  . Hypertension Father   . Heart failure Father    History  Substance Use Topics  . Smoking status: Never Smoker   . Smokeless tobacco: Not on file  . Alcohol Use: 0.6 oz/week    1 Cans of beer per week   OB History   Grav Para Term Preterm Abortions TAB SAB Ect Mult Living                 Review of Systems  Constitutional: Negative.   HENT: Positive for congestion and postnasal drip.   Eyes: Positive for discharge and redness.    Allergies  Shellfish allergy and Codeine  Home Medications   Prior to Admission medications   Medication Sig Start Date End Date Taking? Authorizing Provider  clonazePAM (KLONOPIN) 0.5 MG tablet Take 1 tablet (0.5 mg total) by mouth 2 (two) times daily as needed for anxiety. 04/28/14  Yes Roma KayserKatherine P Schorr, NP  HYDROcodone-acetaminophen (NORCO) 10-325 MG per tablet Take 2 tablets by mouth 4 (four) times daily. 02/01/14  Yes Lonia SkinnerLeslie K Sofia, PA-C  clonazePAM (KLONOPIN) 2 MG tablet Take 1 tablet (2 mg total) by mouth 2 (two) times daily. 02/21/14   Reuben Likesavid C Keller, MD  HYDROcodone-acetaminophen (NORCO/VICODIN) 5-325 MG per tablet Take 1 tablet by  mouth every 4 (four) hours as needed for moderate pain or severe pain. 04/28/14   Roma KayserKatherine P Schorr, NP  ipratropium (ATROVENT) 0.06 % nasal spray Place 2 sprays into both nostrils 4 (four) times daily. 05/11/14   Linna HoffJames D Garlan Drewes, MD  meloxicam (MOBIC) 7.5 MG tablet Take 1 tablet (7.5 mg total) by mouth daily. 04/28/14   Roma KayserKatherine P Schorr, NP  sodium chloride (OCEAN NASAL SPRAY) 0.65 % nasal spray Place 1 spray into the nose as needed for congestion. 03/28/12 03/28/13  Johnsie Kindredarmen L Chatten, NP  tobramycin (TOBREX) 0.3 % ophthalmic solution Place 1 drop into the left eye every 6 (six) hours. 05/11/14   Linna HoffJames D Junior Huezo, MD  traMADol (ULTRAM) 50 MG tablet Take 1 tablet (50 mg total) by mouth every 6 (six) hours as needed for moderate pain or severe pain. 04/28/14   Roma KayserKatherine P Schorr, NP   BP 127/84  Pulse 68  Temp(Src) 98.8 F (37.1 C) (Oral)  Resp 14  SpO2 98% Physical Exam  Nursing note and vitals reviewed. Constitutional: She is oriented to person, place, and time. She appears well-developed and well-nourished. No distress.  HENT:  Right Ear: External ear normal.  Left Ear: External ear normal.  Mouth/Throat: Oropharynx is clear and moist.  Eyes: Conjunctivae and EOM are normal.  Pupils are equal, round, and reactive to light. Left eye exhibits no discharge.  Neck: Normal range of motion. Neck supple.  Lymphadenopathy:    She has no cervical adenopathy.  Neurological: She is alert and oriented to person, place, and time.  Skin: Skin is warm and dry.    ED Course  Procedures (including critical care time) Labs Review Labs Reviewed - No data to display  Imaging Review No results found.   MDM   1. Conjunctiva disorder   2. Chronic pain disorder    Advised that no narcotics would be prescribed as she needs to see her chronic pain management team.    Linna Hoff, MD 05/11/14 1836

## 2014-05-11 NOTE — ED Notes (Signed)
C/o  Redness/irritation of left eye.  Scratchy throat.  Reports cold symptoms x 2 wks.  No relief with otc meds.   Pt also requesting refill on pain and anxiety meds.  Pt states that just went full time with job want have insurance until the first of november.

## 2014-07-01 ENCOUNTER — Encounter (HOSPITAL_COMMUNITY): Payer: Self-pay | Admitting: *Deleted

## 2014-07-01 ENCOUNTER — Emergency Department (INDEPENDENT_AMBULATORY_CARE_PROVIDER_SITE_OTHER)
Admission: EM | Admit: 2014-07-01 | Discharge: 2014-07-01 | Disposition: A | Discharge status: Home / Self Care | Payer: Self-pay | Source: Home / Self Care | Attending: Emergency Medicine | Admitting: Emergency Medicine

## 2014-07-01 DIAGNOSIS — M1612 Unilateral primary osteoarthritis, left hip: Secondary | ICD-10-CM

## 2014-07-01 MED ORDER — KETOROLAC TROMETHAMINE 60 MG/2ML IM SOLN
60.0000 mg | Freq: Once | INTRAMUSCULAR | Status: AC
  Administered 2014-07-01: 60 mg via INTRAMUSCULAR

## 2014-07-01 MED ORDER — HYDROCODONE-ACETAMINOPHEN 5-325 MG PO TABS: 1.0000 | ORAL_TABLET | Freq: Four times a day (QID) | ORAL | Status: DC | PRN

## 2014-07-01 MED ORDER — HYDROCODONE-ACETAMINOPHEN 5-325 MG PO TABS: 1.0000 | ORAL_TABLET | ORAL | Status: DC | PRN

## 2014-07-01 MED ORDER — METHYLPREDNISOLONE (PAK) 4 MG PO TABS: ORAL_TABLET | ORAL | Status: DC

## 2014-07-01 MED ORDER — MELOXICAM 7.5 MG PO TABS: 7.5000 mg | ORAL_TABLET | Freq: Every day | ORAL | Status: DC

## 2014-07-01 MED ORDER — KETOROLAC TROMETHAMINE 60 MG/2ML IM SOLN
INTRAMUSCULAR | Status: AC
  Filled 2014-07-01: qty 2

## 2014-07-01 NOTE — ED Notes (Signed)
C/O left hip pain worsening over past 2-3 weeks.  C/O difficulty with sitting and transitioning to different positions.  Has tried IBU without any relief.  Has appt with ortho 1/11.

## 2014-07-01 NOTE — Discharge Instructions (Signed)
Arthritis, Nonspecific  Arthritis is inflammation of a joint. This usually means pain, redness, warmth or swelling are present. One or more joints may be involved. There are a number of types of arthritis. Your caregiver may not be able to tell what type of arthritis you have right away.  CAUSES   The most common cause of arthritis is the wear and tear on the joint (osteoarthritis). This causes damage to the cartilage, which can break down over time. The knees, hips, back and neck are most often affected by this type of arthritis.  Other types of arthritis and common causes of joint pain include:   Sprains and other injuries near the joint. Sometimes minor sprains and injuries cause pain and swelling that develop hours later.   Rheumatoid arthritis. This affects hands, feet and knees. It usually affects both sides of your body at the same time. It is often associated with chronic ailments, fever, weight loss and general weakness.   Crystal arthritis. Gout and pseudo gout can cause occasional acute severe pain, redness and swelling in the foot, ankle, or knee.   Infectious arthritis. Bacteria can get into a joint through a break in overlying skin. This can cause infection of the joint. Bacteria and viruses can also spread through the blood and affect your joints.   Drug, infectious and allergy reactions. Sometimes joints can become mildly painful and slightly swollen with these types of illnesses.  SYMPTOMS    Pain is the main symptom.   Your joint or joints can also be red, swollen and warm or hot to the touch.   You may have a fever with certain types of arthritis, or even feel overall ill.   The joint with arthritis will hurt with movement. Stiffness is present with some types of arthritis.  DIAGNOSIS   Your caregiver will suspect arthritis based on your description of your symptoms and on your exam. Testing may be needed to find the type of arthritis:   Blood and sometimes urine tests.   X-ray tests  and sometimes CT or MRI scans.   Removal of fluid from the joint (arthrocentesis) is done to check for bacteria, crystals or other causes. Your caregiver (or a specialist) will numb the area over the joint with a local anesthetic, and use a needle to remove joint fluid for examination. This procedure is only minimally uncomfortable.   Even with these tests, your caregiver may not be able to tell what kind of arthritis you have. Consultation with a specialist (rheumatologist) may be helpful.  TREATMENT   Your caregiver will discuss with you treatment specific to your type of arthritis. If the specific type cannot be determined, then the following general recommendations may apply.  Treatment of severe joint pain includes:   Rest.   Elevation.   Anti-inflammatory medication (for example, ibuprofen) may be prescribed. Avoiding activities that cause increased pain.   Only take over-the-counter or prescription medicines for pain and discomfort as recommended by your caregiver.   Cold packs over an inflamed joint may be used for 10 to 15 minutes every hour. Hot packs sometimes feel better, but do not use overnight. Do not use hot packs if you are diabetic without your caregiver's permission.   A cortisone shot into arthritic joints may help reduce pain and swelling.   Any acute arthritis that gets worse over the next 1 to 2 days needs to be looked at to be sure there is no joint infection.  Long-term arthritis treatment involves   modifying activities and lifestyle to reduce joint stress jarring. This can include weight loss. Also, exercise is needed to nourish the joint cartilage and remove waste. This helps keep the muscles around the joint strong.  HOME CARE INSTRUCTIONS    Do not take aspirin to relieve pain if gout is suspected. This elevates uric acid levels.   Only take over-the-counter or prescription medicines for pain, discomfort or fever as directed by your caregiver.   Rest the joint as much as  possible.   If your joint is swollen, keep it elevated.   Use crutches if the painful joint is in your leg.   Drinking plenty of fluids may help for certain types of arthritis.   Follow your caregiver's dietary instructions.   Try low-impact exercise such as:   Swimming.   Water aerobics.   Biking.   Walking.   Morning stiffness is often relieved by a warm shower.   Put your joints through regular range-of-motion.  SEEK MEDICAL CARE IF:    You do not feel better in 24 hours or are getting worse.   You have side effects to medications, or are not getting better with treatment.  SEEK IMMEDIATE MEDICAL CARE IF:    You have a fever.   You develop severe joint pain, swelling or redness.   Many joints are involved and become painful and swollen.   There is severe back pain and/or leg weakness.   You have loss of bowel or bladder control.  Document Released: 09/04/2004 Document Revised: 10/20/2011 Document Reviewed: 09/20/2008  ExitCare Patient Information 2015 [employer]. This information is not intended to replace advice given to you by your health care provider. Make sure you discuss any questions you have with your health care provider.    Hip Pain  Your hip is the joint between your upper legs and your lower pelvis. The bones, cartilage, tendons, and muscles of your hip joint perform a lot of work each day supporting your body weight and allowing you to move around.  Hip pain can range from a minor ache to severe pain in one or both of your hips. Pain may be felt on the inside of the hip joint near the groin, or the outside near the buttocks and upper thigh. You may have swelling or stiffness as well.   HOME CARE INSTRUCTIONS    Take medicines only as directed by your health care provider.   Apply ice to the injured area:   Put ice in a plastic bag.   Place a towel between your skin and the bag.   Leave the ice on for 15-20 minutes at a time, 3-4 times a day.   Keep your leg raised  (elevated) when possible to lessen swelling.   Avoid activities that cause pain.   Follow specific exercises as directed by your health care provider.   Sleep with a pillow between your legs on your most comfortable side.   Record how often you have hip pain, the location of the pain, and what it feels like.  SEEK MEDICAL CARE IF:    You are unable to put weight on your leg.   Your hip is red or swollen or very tender to touch.   Your pain or swelling continues or worsens after 1 week.   You have increasing difficulty walking.   You have a fever.  SEEK IMMEDIATE MEDICAL CARE IF:    You have fallen.   You have a sudden increase in pain   and swelling in your hip.  MAKE SURE YOU:    Understand these instructions.   Will watch your condition.   Will get help right away if you are not doing well or get worse.  Document Released: 01/15/2010 Document Revised: 12/12/2013 Document Reviewed: 03/24/2013  [employer] Patient Information 2015 [employer]. This information is not intended to replace advice given to you by your health care provider. Make sure you discuss any questions you have with your health care provider.

## 2014-07-01 NOTE — ED Provider Notes (Signed)
CSN: [id number]     Arrival date & time 07/01/14  1517 History   First MD Initiated Contact with Patient 07/01/14 1539     Chief Complaint  Patient presents with  . 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 Ethelene Halamos in the past for this. States that she recently joined a gym and has been working out on the treadmill and has developed left hip pain. She reports suing occasional dose of ibuprofen at home with only minimal relief. No specific injury reported. No previous surgery.  Patient is a 58 y.o. female presenting with hip pain. The history is provided by the patient.  Hip Pain This is a recurrent problem.    Past Medical History  Diagnosis Date  . Arthritis    Past Surgical History  Procedure Laterality Date  . Tubal ligation  2000  . Cyst excision Right 1985    thought it was a cyst but it was a lymph gland-R neck  . Dilation and curettage of uterus  2000   Family History  Problem Relation Age of Onset  . Cancer Mother     cervical cancer  . Hypertension Father   . Heart failure Father    History  Substance Use Topics  . Smoking status: Never Smoker   . Smokeless tobacco: Not on file  . Alcohol Use: Yes     Comment: 1-2x/month   OB History    No data available     Review of Systems  All other systems reviewed and are negative.   Allergies  Shellfish allergy and Codeine  Home Medications   Prior to Admission medications   Medication Sig Start Date End Date Taking? Authorizing Provider  clonazePAM (KLONOPIN) 0.5 MG tablet Take 1 tablet (0.5 mg total) by mouth 2 (two) times daily as needed for anxiety. 04/28/14   Roma KayserKatherine P Schorr, NP  clonazePAM (KLONOPIN) 2 MG tablet Take 1 tablet (2 mg total) by mouth 2 (two) times daily. 02/21/14   Reuben Likesavid C Keller, MD  HYDROcodone-acetaminophen [facility](NORCO) 10-325  MG per tablet Take 2 tablets by mouth 4 (four) times daily. 02/01/14   Elson AreasLeslie K Sofia, PA-C  HYDROcodone-acetaminophen (NORCO/VICODIN) 5-325 MG per tablet Take 1 tablet by mouth every 4 (four) hours as needed for moderate pain or severe pain. 04/28/14   Roma KayserKatherine P Schorr, NP  ipratropium (ATROVENT) 0.06 % nasal spray Place 2 sprays into both nostrils 4 (four) times daily. 05/11/14   Linna HoffJames D Kindl, MD  meloxicam (MOBIC) 7.5 MG tablet Take 1 tablet (7.5 mg total) by mouth daily. 04/28/14   Roma KayserKatherine P Schorr, NP  meloxicam (MOBIC) 7.5 MG tablet Take 1 tablet (7.5 mg total) by mouth daily. 07/01/14   Ria ClockJennifer Lee H Presson, PA  methylPREDNIsolone (MEDROL DOSPACK) 4 MG tablet follow package directions 07/01/14   Ria ClockJennifer Lee H Presson, PA  sodium chloride (OCEAN NASAL SPRAY) 0.65 % nasal spray Place 1 spray into the nose as needed for congestion. 03/28/12 03/28/13  Johnsie Kindredarmen L Chatten, NP  tobramycin (TOBREX) 0.3 % ophthalmic solution Place 1 drop into the left eye every 6 (six) hours. 05/11/14   Linna HoffJames D Kindl, MD  traMADol (ULTRAM) 50 MG tablet Take 1 tablet (50 mg total) by mouth every 6 (six) hours as needed for moderate pain or severe pain. 04/28/14   Roma KayserKatherine P Schorr, NP   BP 141/92 mmHg  Pulse 106  Temp(Src) 98.6 F (37 C) (Oral)  Resp 16  SpO2 99% Physical Exam  Constitutional: She is oriented to person, place, and time. She appears well-developed and well-nourished. No distress.  HENT:  Head: Normocephalic and atraumatic.  Cardiovascular: Normal rate.   Pulmonary/Chest: Effort normal.  Musculoskeletal: Normal range of motion.       Left hip: Normal.  NO recreation of pain upon exam. No discomfort with flexion, extension, internal or external rotation of left hip. CSM exam of LLE intact. NO SI joint discomfort. No rash or STS. No ecchymosis  Neurological: She is alert and oriented to person, place, and time.  Skin: Skin is warm and dry. No rash noted. No erythema.  Psychiatric: She has a normal  mood and affect. Her behavior is normal.  Nursing note and vitals reviewed.   ED Course  Procedures (including critical care time) Labs Review Labs Reviewed - No data to display  Imaging Review No results found.   MDM   1. Osteoarthritis of left hip, unspecified osteoarthritis type   Patient initially discharged with prescriptions for Medrol Dose Pack with Rx for Mobic to begin taking once medrol complete. She then stated to both myself and RN that "I know my body and I know what I need and Mobic and Medrol won't work." She then states that she needs to be prescribed Vicodin as she has been in the past. She reports  I advised her that I would prescribe 4 tablets of Vicodin and should she wish to receive additional, she would need to contact Dr. Ethelene Halamos for additional.  Given 60mg  IM dose of Toradol prior to discharge for pain management.   Ria ClockJennifer Lee H Presson, [place]PA 07/01/14 1650

## 2014-07-01 NOTE — ED Notes (Signed)
Upon reviewing Rxs and discharge instructions, pt adamantly stating she needs something for pain.  Explained the steroid and mobic will help with the pain, pt repeatedly stating, "I know it doesn't work".  PA notified.

## 2014-11-30 ENCOUNTER — Emergency Department (INDEPENDENT_AMBULATORY_CARE_PROVIDER_SITE_OTHER)
Admission: EM | Admit: 2014-11-30 | Discharge: 2014-11-30 | Disposition: A | Discharge status: Home / Self Care | Payer: No Typology Code available for payment source | Source: Home / Self Care | Attending: Family Medicine | Admitting: Family Medicine

## 2014-11-30 ENCOUNTER — Encounter (HOSPITAL_COMMUNITY): Payer: Self-pay | Admitting: Emergency Medicine

## 2014-11-30 ENCOUNTER — Emergency Department (INDEPENDENT_AMBULATORY_CARE_PROVIDER_SITE_OTHER): Payer: No Typology Code available for payment source

## 2014-11-30 DIAGNOSIS — S20212A Contusion of left front wall of thorax, initial encounter: Secondary | ICD-10-CM | POA: Diagnosis not present

## 2014-11-30 LAB — POCT URINALYSIS DIP (DEVICE)
BILIRUBIN URINE: NEGATIVE
Glucose, UA: NEGATIVE mg/dL
Hgb urine dipstick: NEGATIVE
Ketones, ur: NEGATIVE mg/dL
Nitrite: NEGATIVE
PH: 6 (ref 5.0–8.0)
Protein, ur: NEGATIVE mg/dL
Specific Gravity, Urine: 1.015 (ref 1.005–1.030)
UROBILINOGEN UA: 0.2 mg/dL (ref 0.0–1.0)

## 2014-11-30 MED ORDER — TRAMADOL HCL 50 MG PO TABS: 50.0000 mg | ORAL_TABLET | Freq: Four times a day (QID) | ORAL | Status: DC | PRN

## 2014-11-30 MED ORDER — IBUPROFEN 800 MG PO TABS: 800.0000 mg | ORAL_TABLET | Freq: Three times a day (TID) | ORAL | Status: DC

## 2014-11-30 NOTE — ED Notes (Signed)
Pt states that she fell and hit her left side on a table
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
## 2016-05-08 ENCOUNTER — Ambulatory Visit: Payer: Self-pay | Admitting: Allergy & Immunology

## 2016-06-05 ENCOUNTER — Encounter: Payer: Self-pay | Admitting: Allergy & Immunology

## 2016-06-05 ENCOUNTER — Encounter (INDEPENDENT_AMBULATORY_CARE_PROVIDER_SITE_OTHER): Payer: Self-pay

## 2016-06-05 ENCOUNTER — Ambulatory Visit (INDEPENDENT_AMBULATORY_CARE_PROVIDER_SITE_OTHER): Payer: Managed Care, Other (non HMO) | Admitting: Allergy & Immunology

## 2016-06-05 VITALS — Ht 63.5 in | Wt 170.6 lb

## 2016-06-05 DIAGNOSIS — T781XXD Other adverse food reactions, not elsewhere classified, subsequent encounter: Secondary | ICD-10-CM | POA: Diagnosis not present

## 2016-06-05 MED ORDER — EPINEPHRINE 0.3 MG/0.3ML IJ SOAJ: 0.3000 mg | Freq: Once | INTRAMUSCULAR | 2 refills | Status: DC

## 2016-06-05 MED ORDER — EPINEPHRINE 0.3 MG/0.3ML IJ SOAJ: 0.3000 mg | Freq: Once | INTRAMUSCULAR | 2 refills | Status: AC

## 2016-06-05 NOTE — Progress Notes (Signed)
NEW PATIENT  Date of Service/Encounter:  06/05/16   Assessment:   Adverse food reaction, subsequent encounter - Plan: Allergy Test, Allergy Panel 19, Seafood Group    Plan/Recommendations:   1. Adverse food reaction, subsequent encounter - Testing was positive to crab and salmon, therefore I would like to get blood work to confirm. - If the blood work is negative, I will recommend that you introduce seafood at home, as long as you get your epinephrine injector. - We will call you with the results.   2. Return if symptoms worsen or fail to improve.  Subjective:   PRUDIE GUTHRIDGE is a 60 y.o. female presenting today for evaluation of  Chief Complaint  Patient presents with  . Allergies    Food - Seafood  .  ARDENE REMLEY has a history of the following: There are no active problems to display for this patient.   History obtained from: chart review and patient.  Eudelia Bunch was referred by Ricke Hey, MD.     Tailor is a 60 y.o. female presenting for re establishment of care. She was last seen 13 years ago by Dr. Ishmael Holter, who has since left the practice. At that time, she was evaluated for a shrimp allergy. Victorya ate shrimp and then went running on a treadmill. 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 She was given an epinephrine autoinjector at that time. Approximately 6 months after that visit, she did try eating some shrimp at a fish fry and developmental tingling. She has had no reactions or exposure since then. Today, she is very interested in introducing seafood back into her diet. Since that time, she has avoided all seafood.   She has otherwise remained healthy. She reports that she is an "old woman" but is doing well nonetheless. Otherwise, there is no history of other atopic diseases, including asthma, drug allergies, environmental allergies, stinging insect allergies, or urticaria.  There is no significant infectious history. Vaccinations are up to date.    Past Medical History: There are no active problems to display for this patient.   Medication List:    Medication List       Accurate as of 06/05/16  3:43 PM. Always use your most recent med list.          EPINEPHrine 0.3 mg/0.3 mL Soaj injection Commonly known as:  AUVI-Q Inject 0.3 mLs (0.3 mg total) into the muscle once.   ibuprofen 800 MG tablet Commonly known as:  ADVIL,MOTRIN Take 1 tablet (800 mg total) by mouth 3 (three) times daily.   LORazepam 1 MG tablet Commonly known as:  ATIVAN Take 1 mg by mouth as needed.   sodium chloride 0.65 % nasal spray Commonly known as:  OCEAN NASAL SPRAY Place 1 spray into the nose as needed for congestion.       Birth History: non-contributory. Born at term without complications.   Developmental History: Melvine has met all milestones on time. She has required no speech therapy, occupational therapy, or physical therapy.   Past Surgical History: Past Surgical History:  Procedure Laterality Date  . CYST EXCISION Right 1985   thought it was a cyst but it was a lymph gland-R neck  . DILATION AND CURETTAGE OF UTERUS  2000  . TUBAL LIGATION  2000     Family History: Family History  Problem Relation Age of Onset  . Cancer Mother  cervical cancer  . Hypertension Father   . Heart failure Father      Social History: Beverely lives at home with her husband. There is carpeting throughout the home. She has an electric heating and central cooling. There are no animals. They do not use dust mite covers for the bedding. She currently works as a Equities trader in hospice.   Review of Systems: a 14-point review of systems is pertinent for what is mentioned in HPI.  Otherwise, all other systems were negative. Constitutional: negative other than that listed in the HPI Eyes: negative other than that listed in the HPI Ears, nose, mouth, throat, and face:  negative other than that listed in the HPI Respiratory: negative other than that listed in the HPI Cardiovascular: negative other than that listed in the HPI Gastrointestinal: negative other than that listed in the HPI Genitourinary: negative other than that listed in the HPI Integument: negative other than that listed in the HPI Hematologic: negative other than that listed in the HPI Musculoskeletal: negative other than that listed in the HPI Neurological: negative other than that listed in the HPI Allergy/Immunologic: negative other than that listed in the HPI    Objective:   Height 5' 3.5" (1.613 m), weight 170 lb 9.6 oz (77.4 kg). Body mass index is 29.75 kg/m.   Physical Exam:  General: Alert, interactive, in no acute distress. Cooperative with the exam.  HEENT: TMs pearly gray, turbinates edematous and pale without discharge, post-pharynx mildly erythematous. Neck: Supple without thyromegaly. Adenopathy: no enlarged lymph nodes appreciated in the anterior cervical, occipital, axillary, epitrochlear, inguinal, or popliteal regions Lungs: Clear to auscultation without wheezing, rhonchi or rales. No increased work of breathing. CV: Normal S1/S2, no murmurs. Capillary refill <2 seconds.  Abdomen: Nondistended, nontender. No guarding or rebound tenderness. Bowel sounds hyperactive  Skin: Warm and dry, without lesions or rashes. Extremities:  No clubbing, cyanosis or edema. Neuro:   Grossly intact.  Diagnostic studies:   Allergy Studies:   Selected Foods Panel: positive to crab and salmon, otherwise negative with adequate controls    Salvatore Marvel, MD [facility]

## 2016-06-05 NOTE — Patient Instructions (Addendum)
1. Adverse food reaction, subsequent encounter - Testing was positive to crab and salmon, therefore I would like to get blood work to confirm. - If the blood work is negative, I will recommend that you introduce seafood at home, as long as you get your epinephrine injector. - We will call you with the results.   2. Return if symptoms worsen or fail to improve.  Please inform us of any Emergency Department visits, hospitalizations, or changes in symptoms. Call us before going to the ED for breathing or allergy symptoms since we might be able to fit you in for a sick visit. Feel free to contact us anytime with any questions, problems, or concerns.  It was a pleasure to meet you today!   Websites that have reliable patient information: 1. American Academy of Asthma, Allergy, and Immunology: www.aaaai.org 2. Food Allergy Research and Education (FARE): foodallergy.org 3. Mothers of Asthmatics: http://www.asthmacommunitynetwork.org 4. American College of Allergy, Asthma, and Immunology: www.acaai.org

## 2016-06-06 LAB — ALLERGY PANEL 19, SEAFOOD GROUP
Allergen, Salmon, f41: <0.1 kU/L
Crab: 0.11 kU/L — ABNORMAL HIGH
Fish Cod: <0.1 kU/L
Lobster: <0.1 kU/L

## 2016-07-17 ENCOUNTER — Encounter (HOSPITAL_COMMUNITY): Payer: Self-pay | Admitting: *Deleted

## 2016-07-17 ENCOUNTER — Emergency Department (HOSPITAL_COMMUNITY)
Admission: EM | Admit: 2016-07-17 | Discharge: 2016-07-17 | Disposition: A | Discharge status: Home / Self Care | Payer: Managed Care, Other (non HMO) | Attending: Emergency Medicine | Admitting: Emergency Medicine

## 2016-07-17 DIAGNOSIS — K644 Residual hemorrhoidal skin tags: Secondary | ICD-10-CM | POA: Diagnosis not present

## 2016-07-17 DIAGNOSIS — M791 Myalgia: Secondary | ICD-10-CM | POA: Diagnosis present

## 2016-07-17 DIAGNOSIS — K602 Anal fissure, unspecified: Secondary | ICD-10-CM | POA: Diagnosis not present

## 2016-07-17 DIAGNOSIS — Z87891 Personal history of nicotine dependence: Secondary | ICD-10-CM | POA: Diagnosis not present

## 2016-07-17 DIAGNOSIS — R234 Changes in skin texture: Secondary | ICD-10-CM

## 2016-07-17 MED ORDER — BACITRACIN ZINC 500 UNIT/GM EX OINT: 1.0000 "application " | TOPICAL_OINTMENT | Freq: Two times a day (BID) | CUTANEOUS | 0 refills | Status: DC

## 2016-07-17 NOTE — Discharge Instructions (Signed)
Apply antibiotic ointment to rectal region twice daily for the next week. I recommend keeping the area clean using into bacterial soap and water, gently pat dry. Refrain from sitting on the toilet for long periods of time which may exacerbate her fissure. Recommend following up with your primary care provider in the next 3-4 days for wound recheck. Please return to the Emergency Department if symptoms worsen or new onset of fever, abdominal pain, vomiting, constipation, rectal pain, pain with having a bowel movement, rectal bleeding, swelling, worsening pain, drainage.

## 2016-07-17 NOTE — ED Triage Notes (Signed)
Pt has had an abscess on buttock x 2 weeks. Reports foul odor discharge tonight.

## 2016-07-17 NOTE — ED Provider Notes (Signed)
[facility]-EMERGENCY DEPT Provider Note   CSN: [id number] Arrival date & time: 07/17/16  2147   By signing my name below, I, Teofilo PodMatthew P. Jamison, attest that this documentation has been prepared under the direction and in the presence of Melburn HakeNicole Nadeau, [place]. Electronically Signed: Teofilo PodMatthew P. Jamison, ED Scribe. 07/17/2016. 10:26 PM.   History   Chief Complaint No chief complaint on file.   The history is provided by the patient. No language interpreter was used.  HPI Comments:  Laura Huff is a 60 y.o. female who presents to the Emergency Department complaining of a constant skin irritation to her buttocks x 2 weeks. Pt states that she has a painful area in her intergluteal cleft. Pt describes the pain as burning. Pt reports malodorous brown material from the area earlier this evening when she wiped. Pt reports that she sits in a car for many hours a day. Pt denies previous similar symptoms. Pt has used neosporin and rubbing alcohol with no relief, and pt takes hydrocodone and xanax regularly. Pt denies fever, drainage, swelling, constipation, blood in stool, pain with BM, nausea, vomiting, diarrhea, abdominal pain, vaginal bleeding/discharge.    Past Medical History:  Diagnosis Date  . Arthritis     There are no active problems to display for this patient.   Past Surgical History:  Procedure Laterality Date  . CYST EXCISION Right 1985   thought it was a cyst but it was a lymph gland-R neck  . DILATION AND CURETTAGE OF UTERUS  2000  . TUBAL LIGATION  2000    OB History    No data available       Home Medications    Prior to Admission medications   Medication Sig Start Date End Date Taking? Authorizing Provider  bacitracin ointment Apply 1 application topically 2 (two) times daily. 07/17/16   Barrett HenleNicole Elizabeth Nadeau, PA-C  ibuprofen (ADVIL,MOTRIN) 800 MG tablet Take 1 tablet (800 mg total) by mouth 3 (three) times daily. 11/30/14   Adrian BlackwaterZachary H Baker, PA-C  LORazepam (ATIVAN) 1 MG  tablet Take 1 mg by mouth as needed. 06/02/16   Historical Provider, MD  sodium chloride (OCEAN NASAL SPRAY) 0.65 % nasal spray Place 1 spray into the nose as needed for congestion. 03/28/12 03/28/13  Johnsie Kindredarmen L Chatten, NP    Family History Family History  Problem Relation Age of Onset  . Cancer Mother     cervical cancer  . Hypertension Father   . Heart failure Father     Social History Social History  Substance Use Topics  . Smoking status: Former Smoker    Types: Cigarettes    Quit date: 02/03/1986  . Smokeless tobacco: Former NeurosurgeonUser  . Alcohol use Yes     Comment: 1-2x/month     Allergies   Shellfish allergy   Review of Systems Review of Systems  Gastrointestinal: Negative for blood in stool, constipation, diarrhea, nausea, rectal pain and vomiting.  Genitourinary: Negative for vaginal bleeding and vaginal discharge.  Skin: Positive for wound.     Physical Exam Updated Vital Signs BP 160/97 (BP Location: Right Arm)   Pulse 85   Temp 98.2 F (36.8 C) (Oral)   Resp 17   SpO2 98%   Physical Exam  Constitutional: She is oriented to person, place, and time. She appears well-developed and well-nourished. No distress.  HENT:  Head: Normocephalic and atraumatic.  Mouth/Throat: Oropharynx is clear and moist. No oropharyngeal exudate.  Eyes: Conjunctivae and EOM are normal. Right eye exhibits  no discharge. Left eye exhibits no discharge. No scleral icterus.  Neck: Normal range of motion. Neck supple.  Cardiovascular: Normal rate, regular rhythm, normal heart sounds and intact distal pulses.   Pulmonary/Chest: Effort normal and breath sounds normal. No respiratory distress. She has no wheezes. She has no rales. She exhibits no tenderness.  Abdominal: Soft. Bowel sounds are normal. She exhibits no distension and no mass. There is no tenderness. There is no rebound and no guarding. No hernia.  Genitourinary: Rectal exam shows external hemorrhoid (Small, non-thrombose) and  fissure. Rectal exam shows no internal hemorrhoid, no mass, no tenderness and anal tone normal.  Genitourinary Comments: Small linear fissure noted to superior intergluteal cleft, no surrounding erythema, swelling, warmth, induration, fluctuance, or drainage. No tenderness over sacral region. No rectal bleeding. No evidence of abscess.   Musculoskeletal: Normal range of motion. She exhibits no edema.  Neurological: She is alert and oriented to person, place, and time.  Skin: Skin is warm and dry. She is not diaphoretic.  Psychiatric: She has a normal mood and affect.  Nursing note and vitals reviewed.    ED Treatments / Results  DIAGNOSTIC STUDIES:  Oxygen Saturation is 98% on RA, normal by my interpretation.    COORDINATION OF CARE:  10:26 PM Discussed treatment plan with pt at bedside and pt agreed to plan.   Labs (all labs ordered are listed, but only abnormal results are displayed) Labs Reviewed - No data to display  EKG  EKG Interpretation None       Radiology No results found.  Procedures Procedures (including critical care time)  Medications Ordered in ED Medications - No data to display   Initial Impression / Assessment and Plan / ED Course  I have reviewed the triage vital signs and the nursing notes.  Pertinent labs & imaging results that were available during my care of the patient were reviewed by me and considered in my medical decision making (see chart for details).  Clinical Course     Patient presents with burning pain to the proximal region for the past 2 weeks. Denies fever, drainage, abdominal pain, rectal pain, rectal bleeding, pain with bowel movement. VSS. Exam revealed small superficial fissure along the superior intergluteal cleft with no evidence of surrounding cellulitis. No palpable abscess on to buttocks or sacral region. Small nonthrombosed external hemorrhoid present, no active bleeding. Rectal exam unremarkable. Suspect patient's  symptoms are likely associated with fissure seen on exam. Plan to discharge patient home with symptomatic treatment. Denies vision follow up with PCP as needed. Discussed return precautions.  Final Clinical Impressions(s) / ED Diagnoses   Final diagnoses:  Fissure of skin    New Prescriptions Discharge Medication List as of 07/17/2016 10:43 PM    START taking these medications   Details  bacitracin ointment Apply 1 application topically 2 (two) times daily., Starting Thu 07/17/2016, Print       I personally performed the services described in this documentation, which was scribed in my presence. The recorded information has been reviewed and is accurate.     Satira Sarkicole Elizabeth EdonNadeau, [place]-C 07/18/16 0005    Lavera Guiseana Duo Liu, MD 07/18/16 [phone]

## 2017-10-15 ENCOUNTER — Other Ambulatory Visit: Payer: Self-pay | Admitting: Pain Medicine

## 2017-10-15 DIAGNOSIS — M545 Low back pain, unspecified: Secondary | ICD-10-CM

## 2017-10-31 ENCOUNTER — Other Ambulatory Visit: Payer: Managed Care, Other (non HMO)

## 2017-11-06 ENCOUNTER — Other Ambulatory Visit: Payer: Managed Care, Other (non HMO)

## 2017-11-07 ENCOUNTER — Ambulatory Visit
Admission: RE | Admit: 2017-11-07 | Discharge: 2017-11-07 | Disposition: A | Discharge status: Home / Self Care | Payer: Managed Care, Other (non HMO) | Source: Ambulatory Visit | Attending: Pain Medicine | Admitting: Pain Medicine

## 2017-11-07 DIAGNOSIS — M545 Low back pain, unspecified: Secondary | ICD-10-CM

## 2017-12-08 ENCOUNTER — Emergency Department (HOSPITAL_BASED_OUTPATIENT_CLINIC_OR_DEPARTMENT_OTHER)
Admit: 2017-12-08 | Discharge: 2017-12-08 | Disposition: A | Discharge status: Home / Self Care | Payer: BLUE CROSS/BLUE SHIELD | Attending: Emergency Medicine | Admitting: Emergency Medicine

## 2017-12-08 ENCOUNTER — Ambulatory Visit (HOSPITAL_COMMUNITY)
Admission: EM | Admit: 2017-12-08 | Discharge: 2017-12-08 | Disposition: A | Discharge status: Home / Self Care | Payer: BLUE CROSS/BLUE SHIELD | Source: Home / Self Care

## 2017-12-08 ENCOUNTER — Encounter (HOSPITAL_COMMUNITY): Payer: Self-pay

## 2017-12-08 ENCOUNTER — Emergency Department (HOSPITAL_COMMUNITY)
Admission: EM | Admit: 2017-12-08 | Discharge: 2017-12-08 | Disposition: A | Discharge status: Home / Self Care | Payer: BLUE CROSS/BLUE SHIELD | Attending: Emergency Medicine | Admitting: Emergency Medicine

## 2017-12-08 DIAGNOSIS — Z87891 Personal history of nicotine dependence: Secondary | ICD-10-CM | POA: Diagnosis not present

## 2017-12-08 DIAGNOSIS — M79609 Pain in unspecified limb: Secondary | ICD-10-CM

## 2017-12-08 DIAGNOSIS — M79661 Pain in right lower leg: Secondary | ICD-10-CM

## 2017-12-08 DIAGNOSIS — Z79899 Other long term (current) drug therapy: Secondary | ICD-10-CM | POA: Insufficient documentation

## 2017-12-08 LAB — BASIC METABOLIC PANEL
Anion gap: 8 (ref 5–15)
BUN: 6 mg/dL (ref 6–20)
CALCIUM: 9.5 mg/dL (ref 8.9–10.3)
CO2: 30 mmol/L (ref 22–32)
CREATININE: 0.72 mg/dL (ref 0.44–1.00)
Chloride: 102 mmol/L (ref 101–111)
GFR calc Af Amer: >60 mL/min (ref >60)
GFR calc non Af Amer: >60 mL/min (ref >60)
Glucose, Bld: 90 mg/dL (ref 65–99)
Potassium: 3.7 mmol/L (ref 3.5–5.1)
SODIUM: 140 mmol/L (ref 135–145)

## 2017-12-08 LAB — CBC WITH DIFFERENTIAL/PLATELET
BASOS PCT: 0 %
Basophils Absolute: 0 10*3/uL (ref 0.0–0.1)
EOS PCT: 1 %
Eosinophils Absolute: 0 10*3/uL (ref 0.0–0.7)
HCT: 39.5 % (ref 36.0–46.0)
Hemoglobin: 12.6 g/dL (ref 12.0–15.0)
LYMPHS ABS: 1.9 10*3/uL (ref 0.7–4.0)
Lymphocytes Relative: 33 %
MCH: 30.3 pg (ref 26.0–34.0)
MCHC: 31.9 g/dL (ref 30.0–36.0)
MCV: 95 fL (ref 78.0–100.0)
MONO ABS: 0.3 10*3/uL (ref 0.1–1.0)
Monocytes Relative: 5 %
Neutro Abs: 3.6 10*3/uL (ref 1.7–7.7)
Neutrophils Relative %: 61 %
PLATELETS: 304 10*3/uL (ref 150–400)
RBC: 4.16 MIL/uL (ref 3.87–5.11)
RDW: 13.2 % (ref 11.5–15.5)
WBC: 5.9 10*3/uL (ref 4.0–10.5)

## 2017-12-08 NOTE — Progress Notes (Signed)
LLE venous duplex prelim: negative for DVT and baker's cyst. Fredrik Mogel Eunice, RDMS, RVT  

## 2017-12-08 NOTE — ED Triage Notes (Signed)
Pt states "I know I have a blood clot in my left" pt informed we are able to call and make an appt, pt states "I need it done right now" Pt advised the ER can have it done right away. Pt decided to go to the ER.

## 2017-12-08 NOTE — Discharge Instructions (Addendum)
Please read attached information. If you experience any new or worsening signs or symptoms please return to the emergency room for evaluation. Please follow-up with your primary care provider or specialist as discussed.  °

## 2017-12-08 NOTE — ED Provider Notes (Signed)
[facility] EMERGENCY DEPARTMENT Provider Note   CSN: [id number] Arrival date & time: 12/08/17  1350   History   Chief Complaint Chief Complaint  Patient presents with  . Leg Pain    HPI Laura Huff is a 62 y.o. female.  HPI   62 year old female presents today with complaints of left calf pain.  Patient notes over the last 2 weeks she has had pain in the calf, worse with ambulation.  Patient denies any significant edema, denies any history DVT PE, estrogen use, recent trauma or prolonged immobilization, surgery or any other significant risk factors.  Patient reports that she has been wearing high heels more often at work.  She denies any fever or infectious etiology.   Past Medical History:  Diagnosis Date  . Arthritis     There are no active problems to display for this patient.   Past Surgical History:  Procedure Laterality Date  . CYST EXCISION Right 1985   thought it was a cyst but it was a lymph gland-R neck  . DILATION AND CURETTAGE OF UTERUS  2000  . TUBAL LIGATION  2000     OB History   None      Home Medications    Prior to Admission medications   Medication Sig Start Date End Date Taking? Authorizing Provider  aspirin-acetaminophen-caffeine (EXCEDRIN MIGRAINE) [phone] MG tablet Take 2 tablets by mouth every 6 (six) hours as needed for headache.   Yes [provider]  fluticasone (FLONASE) 50 MCG/ACT nasal spray Place 1 spray into both nostrils daily.   Yes [provider]  LORazepam (ATIVAN) 1 MG tablet Take 1 mg by mouth as needed. 06/02/16  Yes [provider]  Oxycodone HCl 10 MG TABS Take 10 mg by mouth every 4 (four) hours as needed for pain. 11/11/17  Yes [provider]  bacitracin ointment Apply 1 application topically 2 (two) times daily. Patient not taking: Reported on 12/08/2017 07/17/16   Barrett Henle, PA-C  ibuprofen (ADVIL,MOTRIN) 800 MG tablet Take 1 tablet (800 mg total)  by mouth 3 (three) times daily. Patient not taking: Reported on 12/08/2017 11/30/14   Graylon Good, PA-C  sodium chloride (OCEAN NASAL SPRAY) 0.65 % nasal spray Place 1 spray into the nose as needed for congestion. 03/28/12 03/28/13  Johnsie Kindred, NP    Family History Family History  Problem Relation Age of Onset  . Cancer Mother        cervical cancer  . Hypertension Father   . Heart failure Father     Social History Social History   Tobacco Use  . Smoking status: Former Smoker    Types: Cigarettes    Last attempt to quit: 02/03/1986    Years since quitting: 31.8  . Smokeless tobacco: Former Engineer, water Use Topics  . Alcohol use: Yes    Comment: 1-2x/month  . Drug use: No     Allergies   Shellfish allergy   Review of Systems Review of Systems  All other systems reviewed and are negative.    Physical Exam Updated Vital Signs BP (!) 159/102 (BP Location: Right Arm)   Pulse 82   Temp 98.8 F (37.1 C) (Oral)   Resp 16   SpO2 100%   Physical Exam  Constitutional: She is oriented to person, place, and time. She appears well-developed and well-nourished.  HENT:  Head: Normocephalic and atraumatic.  Eyes: Pupils are equal, round, and reactive to light. Conjunctivae are  normal. Right eye exhibits no discharge. Left eye exhibits no discharge. No scleral icterus.  Neck: Normal range of motion. No JVD present. No tracheal deviation present.  Pulmonary/Chest: Effort normal. No stridor.  Musculoskeletal:  Lower extremities symmetrical, no swelling or edema, left calf with tenderness along the posterior medial aspect, full active range of motion of the ankle, pedal pulses 2+, sensation intact, no redness swelling or warmth to touch-knee atraumatic full active range of motion   Neurological: She is alert and oriented to person, place, and time. Coordination normal.  Psychiatric: She has a normal mood and affect. Her behavior is normal. Judgment and thought content  normal.  Nursing note and vitals reviewed.    ED Treatments / Results  Labs (all labs ordered are listed, but only abnormal results are displayed) Labs Reviewed  CBC WITH DIFFERENTIAL/PLATELET  BASIC METABOLIC PANEL    EKG None  Radiology No results found.  Procedures Procedures (including critical care time)  Medications Ordered in ED Medications - No data to display   Initial Impression / Assessment and Plan / ED Course  I have reviewed the triage vital signs and the nursing notes.  Pertinent labs & imaging results that were available during my care of the patient were reviewed by me and considered in my medical decision making (see chart for details).     61 year old female presents today with calf pain.  This is likely muscular in nature, she has no signs of DVT, no vascular compromise, no signs of infection.  Symptom medic care instructions given, primary care follow-up and strict return precautions given.  Patient verbalized understanding and agreement to today's plan had no further questions or concerns.  Final Clinical Impressions(s) / ED Diagnoses   Final diagnoses:  Right calf pain    ED Discharge Orders    None       Rosalio Loud 12/08/17 1703    Melene Plan, DO 12/08/17 2002

## 2017-12-08 NOTE — ED Triage Notes (Signed)
Pt presents for evaluation of L calf pain x 2 weeks. Pt denies swelling.

## 2017-12-08 NOTE — ED Provider Notes (Signed)
Patient placed in Quick Look pathway, seen and evaluated   Chief Complaint: L calf pain  HPI:   Pt is a 62 y.o. female presenting today with c/o left calf pain for the last 2 weeks.  She denies any injury, trauma, or overuse activities.  She is concerned that she has a blood clot in her calf, although she does not have any personal or family history of DVT/PE, denies estrogen use, recent prolonged travel, surgery, or immobilization.  She denies any swelling, discoloration of the leg, warmth, fevers, chest pain, shortness of breath, or any other complaints at this time.  ROS: +L calf pain. NO LE swelling, discoloration of the leg, warmth, fevers, chest pain, or shortness of breath  Physical Exam:  BP (!) 159/102 (BP Location: Right Arm)   Pulse 82   Temp 98.8 F (37.1 C) (Oral)   Resp 16   SpO2 100%    Gen: No distress  Neuro: Awake and Alert  Skin: Warm    Focused Exam: L calf with mild tenderness, +homan's sign, no LE swelling or warmth, no discoloration. No knee or ankle tenderness, no baker's cyst appreciated. NVI with soft compartments.    Initiation of care has begun. The patient has been counseled on the process, plan, and necessity for staying for the completion/evaluation, and the remainder of the medical screening examination     [address], [place], [place] 12/08/17 1421    Melene Plan, DO 12/08/17 1652

## 2018-04-27 ENCOUNTER — Ambulatory Visit
Admission: RE | Admit: 2018-04-27 | Discharge: 2018-04-27 | Disposition: A | Discharge status: Home / Self Care | Payer: BLUE CROSS/BLUE SHIELD | Source: Ambulatory Visit | Attending: Pain Medicine | Admitting: Pain Medicine

## 2018-04-27 ENCOUNTER — Other Ambulatory Visit: Payer: Self-pay | Admitting: Pain Medicine

## 2018-04-27 DIAGNOSIS — R52 Pain, unspecified: Secondary | ICD-10-CM

## 2018-05-06 ENCOUNTER — Other Ambulatory Visit: Payer: Self-pay | Admitting: Pain Medicine

## 2018-05-06 DIAGNOSIS — M25551 Pain in right hip: Secondary | ICD-10-CM

## 2018-05-25 ENCOUNTER — Ambulatory Visit
Admission: RE | Admit: 2018-05-25 | Discharge: 2018-05-25 | Disposition: A | Discharge status: Home / Self Care | Payer: BLUE CROSS/BLUE SHIELD | Source: Ambulatory Visit | Attending: Pain Medicine | Admitting: Pain Medicine

## 2018-05-25 DIAGNOSIS — M25551 Pain in right hip: Secondary | ICD-10-CM

## 2018-06-08 ENCOUNTER — Ambulatory Visit (INDEPENDENT_AMBULATORY_CARE_PROVIDER_SITE_OTHER): Payer: BLUE CROSS/BLUE SHIELD | Admitting: Physician Assistant

## 2018-06-11 ENCOUNTER — Ambulatory Visit (INDEPENDENT_AMBULATORY_CARE_PROVIDER_SITE_OTHER): Payer: BLUE CROSS/BLUE SHIELD | Admitting: Physician Assistant

## 2018-06-24 ENCOUNTER — Encounter (INDEPENDENT_AMBULATORY_CARE_PROVIDER_SITE_OTHER): Payer: Self-pay | Admitting: Orthopaedic Surgery

## 2018-06-24 ENCOUNTER — Ambulatory Visit (INDEPENDENT_AMBULATORY_CARE_PROVIDER_SITE_OTHER): Payer: BLUE CROSS/BLUE SHIELD | Admitting: Orthopaedic Surgery

## 2018-06-24 DIAGNOSIS — M1611 Unilateral primary osteoarthritis, right hip: Secondary | ICD-10-CM | POA: Diagnosis not present

## 2018-06-24 NOTE — Progress Notes (Signed)
Office Visit Note   Patient: Laura Huff           Date of Birth: 1955/12/29           MRN: [id number] Visit Date: 06/24/2018              Requested by: Billee Cashing, MD [address] [place], [place] [address] PCP: Billee Cashing, MD   Assessment & Plan: Visit Diagnoses:  1. Primary osteoarthritis of right hip     Plan: Impression is mild to moderate right hip osteoarthritis and degenerative joint disease.  MRI was reviewed which shows partial-thickness cartilage loss of the right femoral head and superior acetabulum with a degenerative labral tear.  Clinically speaking patient is doing quite well and is able to do most things.  I recommend trying a cortisone injection to see if this will give her improvement in her pain.  I do not think she is at that point where it hip replacement will benefit her yet.  Questions encouraged and answered.  Follow-up as needed.  Follow-Up Instructions: Return if symptoms worsen or fail to improve.   Orders:  No orders of the defined types were placed in this encounter.  No orders of the defined types were placed in this encounter.     Procedures: No procedures performed   Clinical Data: No additional findings.   Subjective: Chief Complaint  Patient presents with  . Right Hip - Pain  . Left Hip - Pain    Rozalia is a very pleasant 62 year old female comes in with mainly right hip pain for several years.  She is on chronic oxycodone for chronic back pain and right hip pain.  She has taken over-the-counter pain medicines without any significant relief.  Denies any radicular symptoms.  She previously had a right hip cortisone injection which did help tremendously but this was 10 years ago.  Overall she is not significantly limited by her right hip pain.  She is still very active.  She works as a Physiological scientist.   Review of Systems  Constitutional: Negative.   HENT: Negative.   Eyes: Negative.   Respiratory: Negative.     Cardiovascular: Negative.   Endocrine: Negative.   Musculoskeletal: Negative.   Neurological: Negative.   Hematological: Negative.   Psychiatric/Behavioral: Negative.   All other systems reviewed and are negative.    Objective: Vital Signs: There were no vitals taken for this visit.  Physical Exam  Constitutional: She is oriented to person, place, and time. She appears well-developed and well-nourished.  HENT:  Head: Normocephalic and atraumatic.  Eyes: EOM are normal.  Neck: Neck supple.  Pulmonary/Chest: Effort normal.  Abdominal: Soft.  Neurological: She is alert and oriented to person, place, and time.  Skin: Skin is warm. Capillary refill takes less than 2 seconds.  Psychiatric: She has a normal mood and affect. Her behavior is normal. Judgment and thought content normal.  Nursing note and vitals reviewed.   Ortho Exam Right hip exam shows painful range of motion with positive FADIR and positive logroll. Specialty Comments:  No specialty comments available.  Imaging: No results found.   PMFS History: There are no active problems to display for this patient.  Past Medical History:  Diagnosis Date  . Arthritis     Family History  Problem Relation Age of Onset  . Cancer Mother        cervical cancer  . Hypertension Father   . Heart failure Father  Past Surgical History:  Procedure Laterality Date  . CYST EXCISION Right 1985   thought it was a cyst but it was a lymph gland-R neck  . DILATION AND CURETTAGE OF UTERUS  2000  . TUBAL LIGATION  2000   Social History   Occupational History  . Not on file  Tobacco Use  . Smoking status: Former Smoker    Types: Cigarettes    Last attempt to quit: 02/03/1986    Years since quitting: 32.4  . Smokeless tobacco: Former Engineer, waterUser  Substance and Sexual Activity  . Alcohol use: Yes    Comment: 1-2x/month  . Drug use: No  . Sexual activity: Yes    Partners: Male

## 2018-06-29 ENCOUNTER — Ambulatory Visit (INDEPENDENT_AMBULATORY_CARE_PROVIDER_SITE_OTHER): Payer: BLUE CROSS/BLUE SHIELD | Admitting: Family Medicine

## 2018-06-29 DIAGNOSIS — M1611 Unilateral primary osteoarthritis, right hip: Secondary | ICD-10-CM | POA: Diagnosis not present

## 2018-06-29 MED ORDER — LIDOCAINE HCL (PF) 1 % IJ SOLN
5.0000 mL | INTRAMUSCULAR | Status: AC | PRN
  Administered 2018-06-29: 5 mL

## 2018-06-29 MED ORDER — METHYLPREDNISOLONE ACETATE 40 MG/ML IJ SUSP
40.0000 mg | INTRAMUSCULAR | Status: AC | PRN
  Administered 2018-06-29: 40 mg via INTRA_ARTICULAR

## 2018-06-29 NOTE — Progress Notes (Signed)
   Office Visit Note   Patient: Laura Huff           Date of Birth: Jun 18, 1956           MRN: [id number] Visit Date: 06/29/2018 Requested by: Billee CashingMcKenzie, [place], MD [address] [place], [place] [id number] PCP: Billee CashingMcKenzie, [place], MD  Subjective: Chief Complaint  Patient presents with  . Right Hip - Pain, Follow-up    US-guided injection right hip    HPI: Here for ultrasound-guided right hip injection.  History of moderate osteoarthritis.  Daily pain and stiffness, especially in the past month.  She also would like to establish primary care.  Her previous PCP has retired.  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  Overall she is in good health.                ROS: Noncontributory  Objective: Vital Signs: There were no vitals taken for this visit.  Physical Exam:  Right hip: Slightly limited internal rotation range of motion with quite a bit of pain on passive movement.  Slightly tender at the greater trochanter as well.  Imaging: None today.  Assessment & Plan: 1.  Chronic right hip pain due to osteoarthritis -Injection today.  Follow-up as needed.   Follow-Up Instructions: No follow-ups on file.      Procedures: Large Joint Inj: R hip joint on 06/29/2018 4:58 PM Indications: pain Details: 22 G 3.5 in needle, ultrasound-guided anterolateral approach  Arthrogram: No  Medications: 5 mL lidocaine (PF) 1 %; 40 mg methylPREDNISolone acetate 40 MG/ML  Needle was passed through the iliofemoral ligament into the femoral head/neck junction.  Injectate was seen filling the joint capsule.  Patient had modest improvement in pain during the immediate anesthetic phase. Procedure, treatment alternatives, risks and benefits explained, specific risks discussed. Consent was given by the patient. Immediately prior to procedure a time out was called to verify the correct patient, procedure, equipment, support staff and site/side marked as required. Patient was prepped and draped  in the usual sterile fashion.      No notes on file    PMFS History: There are no active problems to display for this patient.  Past Medical History:  Diagnosis Date  . Arthritis     Family History  Problem Relation Age of Onset  . Cancer Mother        cervical cancer  . Hypertension Father   . Heart failure Father     Past Surgical History:  Procedure Laterality Date  . CYST EXCISION Right 1985   thought it was a cyst but it was a lymph gland-R neck  . DILATION AND CURETTAGE OF UTERUS  2000  . TUBAL LIGATION  2000   Social History   Occupational History  . Not on file  Tobacco Use  . Smoking status: Former Smoker    Types: Cigarettes    Last attempt to quit: 02/03/1986    Years since quitting: 32.4  . Smokeless tobacco: Former Engineer, waterUser  Substance and Sexual Activity  . Alcohol use: Yes    Comment: 1-2x/month  . Drug use: No  . Sexual activity: Yes    Partners: Male

## 2018-07-16 ENCOUNTER — Telehealth (INDEPENDENT_AMBULATORY_CARE_PROVIDER_SITE_OTHER): Payer: Self-pay | Admitting: Orthopaedic Surgery

## 2018-07-16 NOTE — Telephone Encounter (Signed)
OV notes faxed to [facility] [phone], Theda Belfastcalback Angela [phone]

## 2018-09-28 ENCOUNTER — Telehealth (INDEPENDENT_AMBULATORY_CARE_PROVIDER_SITE_OTHER): Payer: Self-pay | Admitting: Family Medicine

## 2018-09-28 NOTE — Telephone Encounter (Signed)
I spoke with the patient. She c/o numbness/tingling in her hands/fingers "for a long time," but worsening. For about 1 week, she has been "swimmy-headed" with pain behind her right eye and difficulty concentrating.  She says she has dozed off at stoplights 3 times in the last 6 months.  I advised her to come here for evaluation by Dr. Prince Rome and he will determine if she needs a referral to a neurologist &/or testing. Appointment scheduled for tomorrow afternoon with Dr. Prince Rome.

## 2018-09-28 NOTE — Telephone Encounter (Signed)
Patient called stated needing a referral to [facility]. Patient stated having stroke and seizure like symptoms.  Offered patient an appointment and she was adamant she get a referral.  Please call patient to advise.  [phone]

## 2018-09-29 ENCOUNTER — Ambulatory Visit (INDEPENDENT_AMBULATORY_CARE_PROVIDER_SITE_OTHER): Payer: BLUE CROSS/BLUE SHIELD | Admitting: Family Medicine

## 2018-09-29 ENCOUNTER — Encounter (INDEPENDENT_AMBULATORY_CARE_PROVIDER_SITE_OTHER): Payer: Self-pay | Admitting: Family Medicine

## 2018-09-29 VITALS — BP 147/82 | HR 76 | Temp 99.3°F | Resp 16 | Ht 63.5 in | Wt 143.5 lb

## 2018-09-29 DIAGNOSIS — R002 Palpitations: Secondary | ICD-10-CM | POA: Diagnosis not present

## 2018-09-29 DIAGNOSIS — H814 Vertigo of central origin: Secondary | ICD-10-CM

## 2018-09-29 DIAGNOSIS — I7300 Raynaud's syndrome without gangrene: Secondary | ICD-10-CM | POA: Diagnosis not present

## 2018-09-29 DIAGNOSIS — R42 Dizziness and giddiness: Secondary | ICD-10-CM | POA: Diagnosis not present

## 2018-09-29 DIAGNOSIS — R5383 Other fatigue: Secondary | ICD-10-CM

## 2018-09-29 NOTE — Progress Notes (Signed)
   Office Visit Note   Patient: Laura Huff           Date of Birth: February 16, 1956           MRN: [id number] Visit Date: 09/29/2018 Requested by: Billee Cashing, MD [address] [place], [place] [address] PCP: Billee Cashing, MD  Subjective: Chief Complaint  Patient presents with  . Dizziness  . pain behind left eye  . N/T in hands    HPI: She is here with dizziness.  Symptoms for the past 5 or 6 months intermittently.  Occasional pressure behind her eyes, dizziness, she falls asleep at the wheel had a stop sign sometimes.  She gets occasional numbness and tingling in her hands.  No changes in her medications.  No chest pain or palpitations.              ROS: Otherwise noncontributory  Objective: Vital Signs: BP (!) 147/82 (BP Location: Left Arm, Patient Position: Sitting, Cuff Size: Normal)   Pulse 76   Temp 99.3 F (37.4 C)   Resp 16   Ht 5' 3.5" (1.613 m)   Wt 143 lb 8 oz (65.1 kg)   SpO2 100%   BMI 25.02 kg/m   Physical Exam:  HEENT:  [place]/AT, PERRLA, EOM Full, no nystagmus.  Funduscopic examination within normal limits.  No conjunctival erythema.  Tympanic membranes are pearly gray with normal landmarks.  External ear canals are normal.  Nasal passages are clear.  Oropharynx is clear.  No significant lymphadenopathy.  No thyromegaly or nodules.  2+ carotid pulses without bruits. CV: Heart rate is irregular without murmurs, rubs, or gallops.  No peripheral edema.  2+ radial and posterior tibial pulses. Lungs: Clear to auscultation throughout with no wheezing or areas of consolidation.    Imaging: None today  Assessment & Plan: 1.  Dizziness with occasional head pressure and palpitations, etiology uncertain. -Labs today, EKG ordered, brain MRI scan.  Specialty referral depending on test results.     Procedures: No procedures performed  No notes on file     PMFS History: There are no active problems to display for this patient.  Past Medical History:   Diagnosis Date  . Arthritis     Family History  Problem Relation Age of Onset  . Cancer Mother        cervical cancer  . Hypertension Father   . Heart failure Father     Past Surgical History:  Procedure Laterality Date  . CYST EXCISION Right 1985   thought it was a cyst but it was a lymph gland-R neck  . DILATION AND CURETTAGE OF UTERUS  2000  . TUBAL LIGATION  2000   Social History   Occupational History  . Not on file  Tobacco Use  . Smoking status: Former Smoker    Types: Cigarettes    Last attempt to quit: 02/03/1986    Years since quitting: 32.6  . Smokeless tobacco: Former Engineer, water and Sexual Activity  . Alcohol use: Yes    Comment: 1-2x/month  . Drug use: No  . Sexual activity: Yes    Partners: Male

## 2018-09-30 ENCOUNTER — Telehealth (INDEPENDENT_AMBULATORY_CARE_PROVIDER_SITE_OTHER): Payer: Self-pay | Admitting: Family Medicine

## 2018-09-30 LAB — CBC WITH DIFFERENTIAL/PLATELET
ABSOLUTE MONOCYTES: 302 {cells}/uL (ref 200–950)
BASOS PCT: 1 %
Basophils Absolute: 42 cells/uL (ref 0–200)
EOS PCT: 2.7 %
Eosinophils Absolute: 113 cells/uL (ref 15–500)
HCT: 35 % (ref 35.0–45.0)
Hemoglobin: 11.5 g/dL — ABNORMAL LOW (ref 11.7–15.5)
LYMPHS ABS: 2155 {cells}/uL (ref 850–3900)
MCH: 30.5 pg (ref 27.0–33.0)
MCHC: 32.9 g/dL (ref 32.0–36.0)
MCV: 92.8 fL (ref 80.0–100.0)
MPV: 12.5 fL (ref 7.5–12.5)
Monocytes Relative: 7.2 %
NEUTROS ABS: 1588 {cells}/uL (ref 1500–7800)
Neutrophils Relative %: 37.8 %
PLATELETS: 225 10*3/uL (ref 140–400)
RBC: 3.77 10*6/uL — ABNORMAL LOW (ref 3.80–5.10)
RDW: 11.8 % (ref 11.0–15.0)
Total Lymphocyte: 51.3 %
WBC: 4.2 10*3/uL (ref 3.8–10.8)

## 2018-09-30 LAB — COMPREHENSIVE METABOLIC PANEL
AG Ratio: 1.5 (calc) (ref 1.0–2.5)
ALBUMIN MSPROF: 4.1 g/dL (ref 3.6–5.1)
ALT: 5 U/L — ABNORMAL LOW (ref 6–29)
AST: 16 U/L (ref 10–35)
Alkaline phosphatase (APISO): 44 U/L (ref 37–153)
BUN: 7 mg/dL (ref 7–25)
CO2: 27 mmol/L (ref 20–32)
CREATININE: 0.95 mg/dL (ref 0.50–0.99)
Calcium: 9.3 mg/dL (ref 8.6–10.4)
Chloride: 102 mmol/L (ref 98–110)
GLOBULIN: 2.7 g/dL (ref 1.9–3.7)
Glucose, Bld: 113 mg/dL — ABNORMAL HIGH (ref 65–99)
POTASSIUM: 3.5 mmol/L (ref 3.5–5.3)
SODIUM: 139 mmol/L (ref 135–146)
TOTAL PROTEIN: 6.8 g/dL (ref 6.1–8.1)
Total Bilirubin: 0.4 mg/dL (ref 0.2–1.2)

## 2018-09-30 LAB — THYROID PANEL WITH TSH
Free Thyroxine Index: 2.1 (ref 1.4–3.8)
T3 Uptake: 29 % (ref 22–35)
T4, Total: 7.1 ug/dL (ref 5.1–11.9)
TSH: 0.74 mIU/L (ref 0.40–4.50)

## 2018-09-30 LAB — ANA: ANA: NEGATIVE

## 2018-09-30 LAB — VITAMIN D 25 HYDROXY (VIT D DEFICIENCY, FRACTURES): Vit D, 25-Hydroxy: 7 ng/mL — ABNORMAL LOW (ref 30–100)

## 2018-09-30 LAB — FERRITIN: Ferritin: 73 ng/mL (ref 16–288)

## 2018-09-30 LAB — MAGNESIUM: Magnesium: 1.9 mg/dL (ref 1.5–2.5)

## 2018-09-30 LAB — VITAMIN B12: Vitamin B-12: 478 pg/mL (ref 200–1100)

## 2018-09-30 NOTE — Telephone Encounter (Signed)
Labs are notable for the following:  Vitamin D is severely low at 7.  We want this to be 50-80.  I recommend taking over-the-counter vitamin D-3, 5000 IU tablets, 2 of them daily for the next 3 months and then recheck the level to see whether we can switch to a lower maintenance dosage.  Hemoglobin is slightly low at 11.5 suggesting mild iron deficiency.  I recommend increasing intake of iron rich foods.  Others look good so far.  A few still pending.  MJH

## 2018-10-01 ENCOUNTER — Telehealth (INDEPENDENT_AMBULATORY_CARE_PROVIDER_SITE_OTHER): Payer: Self-pay | Admitting: Family Medicine

## 2018-10-01 NOTE — Telephone Encounter (Signed)
I called and advised the patient of her lab results and instructions on the vitamin D3 - which she wrote down and repeated back to me - and instructions on increasing iron-rich foods. Repeat lab in 3 months.

## 2018-10-01 NOTE — Telephone Encounter (Signed)
Remainder of labs were normal.   

## 2018-11-04 ENCOUNTER — Encounter (INDEPENDENT_AMBULATORY_CARE_PROVIDER_SITE_OTHER): Payer: Self-pay | Admitting: *Deleted

## 2019-07-31 ENCOUNTER — Encounter (HOSPITAL_COMMUNITY): Payer: Self-pay | Admitting: *Deleted

## 2019-07-31 ENCOUNTER — Emergency Department (HOSPITAL_COMMUNITY)
Admission: EM | Admit: 2019-07-31 | Discharge: 2019-07-31 | Disposition: A | Discharge status: Home / Self Care | Payer: BC Managed Care – PPO | Attending: Emergency Medicine | Admitting: Emergency Medicine

## 2019-07-31 DIAGNOSIS — T7802XA Anaphylactic reaction due to shellfish (crustaceans), initial encounter: Secondary | ICD-10-CM | POA: Diagnosis present

## 2019-07-31 DIAGNOSIS — L509 Urticaria, unspecified: Secondary | ICD-10-CM

## 2019-07-31 DIAGNOSIS — Z87891 Personal history of nicotine dependence: Secondary | ICD-10-CM | POA: Diagnosis not present

## 2019-07-31 DIAGNOSIS — Z91013 Allergy to seafood: Secondary | ICD-10-CM | POA: Diagnosis not present

## 2019-07-31 DIAGNOSIS — Z91018 Allergy to other foods: Secondary | ICD-10-CM

## 2019-07-31 DIAGNOSIS — Z79899 Other long term (current) drug therapy: Secondary | ICD-10-CM | POA: Insufficient documentation

## 2019-07-31 MED ORDER — EPINEPHRINE 0.3 MG/0.3ML IJ SOAJ: 0.3000 mg | INTRAMUSCULAR | 1 refills | Status: AC | PRN

## 2019-07-31 MED ORDER — METHYLPREDNISOLONE SODIUM SUCC 125 MG IJ SOLR
125.0000 mg | Freq: Once | INTRAMUSCULAR | Status: AC
  Administered 2019-07-31: 125 mg via INTRAVENOUS
  Filled 2019-07-31: qty 2

## 2019-07-31 NOTE — ED Triage Notes (Signed)
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 States she developed hives on both arms, states her tongue felt a little thick, no difficulty breathing or swallowing. States she had a epip pen  However it expired 2018.  Patient was also given Benafryl 75 mg per ems. Upon arrival alert oriented no resp problems.

## 2019-07-31 NOTE — ED Provider Notes (Addendum)
[facility] EMERGENCY DEPARTMENT Provider Note   CSN: [id number] Arrival date & time:        History Chief Complaint  Patient presents with  . Allergic Reaction    Laura Huff is a 63 y.o. female.  The history is provided by the patient. No language interpreter was used.  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  Pt reports she ate 2 large shrimp.  Pt reports she took benadryl and used her epi pen.  Pt reports she thought her allergist told her she was allergic to seafood only when she exerted herself.  Pt states she thought she could eat seafood as long as she laid down.     Past Medical History:  Diagnosis Date  . Arthritis     There are no problems to display for this patient.   Past Surgical History:  Procedure Laterality Date  . CYST EXCISION Right 1985   thought it was a cyst but it was a lymph gland-R neck  . DILATION AND CURETTAGE OF UTERUS  2000  . TUBAL LIGATION  2000     OB History   No obstetric history on file.     Family History  Problem Relation Age of Onset  . Cancer Mother        cervical cancer  . Hypertension Father   . Heart failure Father     Social History   Tobacco Use  . Smoking status: Former Smoker    Types: Cigarettes    Quit date: 02/03/1986    Years since quitting: 33.5  . Smokeless tobacco: Former Engineer, water Use Topics  . Alcohol use: Yes    Comment: 1-2x/month  . Drug use: No    Home Medications Prior to Admission medications   Medication Sig Start Date End Date Taking? Authorizing Provider  aspirin-acetaminophen-caffeine (EXCEDRIN MIGRAINE) [id number] MG tablet Take 2 tablets by mouth every 6 (six) hours as needed for headache.    [provider]  bisacodyl (DULCOLAX) 5 MG EC tablet Take 5 mg by  mouth daily as needed for moderate constipation.    [provider]  fluticasone (FLONASE) 50 MCG/ACT nasal spray Place 1 spray into both nostrils daily.    [provider]  omeprazole (PRILOSEC) 20 MG capsule Take 20 mg by mouth daily.    [provider]  Oxycodone HCl 10 MG TABS Take 10 mg by mouth every 4 (four) hours as needed for pain. 11/11/17   [provider]    Allergies    Shellfish allergy  Review of Systems   Review of Systems  Skin: Positive for itching and rash.  All other systems reviewed and are negative.   Physical Exam Updated Vital Signs BP (!) 151/92 (BP Location: Right Arm)   Pulse 67   Temp 98.1 F (36.7 C) (Oral)   Resp 16   Ht 5\' 4"  (1.626 m)   Wt 63.5 kg   SpO2 98%   BMI 24.03 kg/m   Physical Exam Vitals and nursing note reviewed.  Constitutional:      Appearance: She is well-developed.  HENT:     Head: Normocephalic.     Mouth/Throat:     Mouth: Mucous membranes are moist.  Eyes:     Pupils: Pupils are equal, round, and reactive to light.  Cardiovascular:     Rate and Rhythm: Normal rate.  Pulmonary:     Effort: Pulmonary effort is normal.  Abdominal:     General: There is no distension.  Musculoskeletal:        General: Normal range of motion.     Cervical back: Normal range of motion.  Skin:    Findings: Rash present.     Comments: Hives back and bilat arms   Neurological:     Mental Status: She is alert and oriented to person, place, and time.  Psychiatric:        Mood and Affect: Mood normal.     ED Results / Procedures / Treatments   Labs (all labs ordered are listed, but only abnormal results are displayed) Labs Reviewed - No data to display  EKG None  Radiology No results found.  Procedures Procedures (including critical care time)  Medications Ordered in ED Medications  methylPREDNISolone sodium succinate (SOLU-MEDROL) 125 mg/2 mL injection 125 mg (125 mg Intravenous Given  07/31/19 1040)    ED Course  I have reviewed the triage vital signs and the nursing notes.  Pertinent labs & imaging results that were available during my care of the patient were reviewed by me and considered in my medical decision making (see chart for details).    MDM Rules/Calculators/A&P                      MDM  Pt used epi at 8:45.  Pt given solumedrol.  Pt observed.  Pt feels better.  Pt reexamined, no rash, Pt advised to follow up with allergist for evaluation  Final Clinical Impression(s) / ED Diagnoses Final diagnoses:  Urticaria  Food allergy    Rx / DC Orders ED Discharge Orders         Ordered    EPINEPHrine 0.3 mg/0.3 mL IJ SOAJ injection  As needed     07/31/19 1045        An After Visit Summary was printed and given to the patient.    Fransico Meadow, PA-C 07/31/19 1227    Fransico Meadow, [place] 07/31/19 1233    Quintella Reichert, MD 08/01/19 [phone]

## 2019-07-31 NOTE — Discharge Instructions (Addendum)
Return if any problems.

## 2019-09-16 ENCOUNTER — Telehealth: Payer: Self-pay | Admitting: Family Medicine

## 2019-09-16 NOTE — Telephone Encounter (Signed)
Pt called in wanting to make an appt. For a lump in her breast; ok to schedule? Please let me know if you'd like me to call pt to schedule.   [phone]

## 2019-09-16 NOTE — Telephone Encounter (Signed)
Ok to schedule, 20 minutes.

## 2019-09-19 ENCOUNTER — Ambulatory Visit (INDEPENDENT_AMBULATORY_CARE_PROVIDER_SITE_OTHER): Payer: BC Managed Care – PPO | Admitting: Family Medicine

## 2019-09-19 ENCOUNTER — Other Ambulatory Visit: Payer: Self-pay

## 2019-09-19 ENCOUNTER — Encounter: Payer: Self-pay | Admitting: Family Medicine

## 2019-09-19 DIAGNOSIS — N6315 Unspecified lump in the right breast, overlapping quadrants: Secondary | ICD-10-CM

## 2019-09-19 DIAGNOSIS — N644 Mastodynia: Secondary | ICD-10-CM

## 2019-09-19 NOTE — Progress Notes (Signed)
   Office Visit Note   Patient: Laura Huff           Date of Birth: [birth year]           MRN: [id number] Visit Date: 09/19/2019 Requested by: Lavada Mesi, MD [address] [place],  [place] [address] PCP: Lavada Mesi, MD  Subjective: Chief Complaint  Patient presents with  . lump lateral right breast, found 09/15/19    HPI: She is here with right breast lump.  About 4 days ago she noticed some soreness on the lateral side of her right breast.  She felt around and noticed a lump.  She has never had breast problems before.  She has not had a mammogram in at least 20 years.  No significant family history of breast cancer.  Denies any fevers, chills, night sweats, unintentional weight change, nipple discharge.  She has been menopausal for at least 20 years.              ROS:   All other systems were reviewed and are negative.  Objective: Vital Signs: There were no vitals taken for this visit.  Physical Exam:  General:  Alert and oriented, in no acute distress. Pulm:  Breathing unlabored. Psy:  Normal mood, congruent affect. Skin: Female chaperone was present.  There is no dimpling of her skin, no erythema. Breasts: Symmetric with no nipple discharge.  She has fibrous breast tissue bilaterally.  There is a larger nodule below the right areola and about the 6 o'clock position.  The area in question is in the 9 o'clock position on the lateral side of the breast, feels like it is just beneath the skin and with smooth borders, it moves freely.  Left breast has fibrous tissue as well.  No axillary lymphadenopathy on either side.  Imaging: None today  Assessment & Plan: 1.  Right breast pain with nodule, etiology uncertain. -Bilateral mammogram ordered.     Procedures: No procedures performed  No notes on file     PMFS History: There are no problems to display for this patient.  Past Medical History:  Diagnosis Date  . Arthritis     Family History  Problem Relation Age  of Onset  . Cancer Mother        cervical cancer  . Hypertension Father   . Heart failure Father     Past Surgical History:  Procedure Laterality Date  . CYST EXCISION Right 1985   thought it was a cyst but it was a lymph gland-R neck  . DILATION AND CURETTAGE OF UTERUS  2000  . TUBAL LIGATION  2000   Social History   Occupational History  . Not on file  Tobacco Use  . Smoking status: Former Smoker    Types: Cigarettes    Quit date: 02/03/1986    Years since quitting: 33.6  . Smokeless tobacco: Former Engineer, water and Sexual Activity  . Alcohol use: Yes    Comment: 1-2x/month  . Drug use: No  . Sexual activity: Yes    Partners: Male

## 2019-09-21 ENCOUNTER — Other Ambulatory Visit: Payer: Self-pay | Admitting: Family Medicine

## 2019-09-21 ENCOUNTER — Telehealth: Payer: Self-pay | Admitting: Family Medicine

## 2019-09-21 DIAGNOSIS — N6315 Unspecified lump in the right breast, overlapping quadrants: Secondary | ICD-10-CM

## 2019-09-21 DIAGNOSIS — N644 Mastodynia: Secondary | ICD-10-CM

## 2019-09-21 MED ORDER — DIAZEPAM 5 MG PO TABS: ORAL_TABLET | ORAL | 0 refills | Status: DC

## 2019-09-21 NOTE — Telephone Encounter (Signed)
Please advise on the anxiety medication request. I will give [facility] the [facility] to [facility] so she may call them.

## 2019-09-21 NOTE — Telephone Encounter (Signed)
Patient called. She would like to know if someone would be calling her to set up the mammogram or does she have to do that. She also would like something for anxiety called in. Her call back number is [phone]

## 2019-09-21 NOTE — Telephone Encounter (Signed)
I called and advised the patient of the phone number for [facility] so that she may call and schedule the appointment herself - the order for diagnostic mammogram is in the chart already. Dr. Prince Rome has prescribed something for anxiety - just check with the pharmacy.

## 2019-09-21 NOTE — Addendum Note (Signed)
Addended by: Lillia Carmel on: 09/21/2019 11:42 AM   Modules accepted: Orders

## 2019-09-21 NOTE — Telephone Encounter (Signed)
Rx sent 

## 2019-10-04 ENCOUNTER — Telehealth: Payer: Self-pay | Admitting: Family Medicine

## 2019-10-04 ENCOUNTER — Ambulatory Visit
Admission: RE | Admit: 2019-10-04 | Discharge: 2019-10-04 | Disposition: A | Discharge status: Home / Self Care | Payer: BC Managed Care – PPO | Source: Ambulatory Visit | Attending: Family Medicine | Admitting: Family Medicine

## 2019-10-04 ENCOUNTER — Other Ambulatory Visit: Payer: Self-pay

## 2019-10-04 DIAGNOSIS — N6315 Unspecified lump in the right breast, overlapping quadrants: Secondary | ICD-10-CM

## 2019-10-04 DIAGNOSIS — N644 Mastodynia: Secondary | ICD-10-CM

## 2019-10-04 NOTE — Telephone Encounter (Signed)
I called and spoke with the patient - [facility] notified her of the results when she was there. She was told to follow up in 1 year - she will call and schedule that herself.

## 2019-10-04 NOTE — Telephone Encounter (Signed)
Breast mammogram & ultrasound show a lipoma in the right breast.  No sign of cancer.

## 2019-10-20 ENCOUNTER — Ambulatory Visit: Payer: BC Managed Care – PPO | Attending: Internal Medicine

## 2019-10-20 DIAGNOSIS — Z23 Encounter for immunization: Secondary | ICD-10-CM

## 2019-10-20 NOTE — Progress Notes (Signed)
   Covid-19 Vaccination Clinic  Name:  Laura Huff    MRN: [id number] DOB: 09-Dec-1955  10/20/2019  [name] was observed post Covid-19 immunization for 30 minutes based on pre-vaccination screening without incident. She was provided with Vaccine Information Sheet and instruction to access the V-Safe system.   [name] was instructed to call 911 with any severe reactions post vaccine: Marland Kitchen Difficulty breathing  . Swelling of face and throat  . A fast heartbeat  . A bad rash all over body  . Dizziness and weakness   Immunizations Administered    Name Date Dose VIS Date Route   Pfizer COVID-19 Vaccine 10/20/2019  8:38 AM 0.3 mL 07/22/2019 Intramuscular   Manufacturer: [employer], [employer]   Lot: [id number]   NDC: [address]-2

## 2019-10-28 ENCOUNTER — Telehealth: Payer: Self-pay | Admitting: Family Medicine

## 2019-10-28 MED ORDER — TRAZODONE HCL 50 MG PO TABS: ORAL_TABLET | ORAL | 3 refills | Status: AC

## 2019-10-28 NOTE — Telephone Encounter (Signed)
Rx sent for trazodone

## 2019-10-28 NOTE — Telephone Encounter (Signed)
See message below °

## 2019-10-28 NOTE — Telephone Encounter (Signed)
Patient called requesting medication to help her sleep. Patient states she is having trouble sleeping and needs volume or whatever Dr. Prince Rome thinks is best Patient phone number is [phone].

## 2019-10-31 ENCOUNTER — Encounter: Payer: Self-pay | Admitting: Family Medicine

## 2019-10-31 NOTE — Telephone Encounter (Signed)
I don't want her on xanax or valium.  That is not a safe category for her.

## 2019-10-31 NOTE — Telephone Encounter (Signed)
Called patient to advise. She did not answer and could not leave VM since mailbox is full.

## 2019-10-31 NOTE — Telephone Encounter (Signed)
She wanted a CB from you to discuss.

## 2019-10-31 NOTE — Telephone Encounter (Signed)
If possible please have her send a MyChart message.

## 2019-10-31 NOTE — Telephone Encounter (Signed)
Called patient to advise. She Does not want to take Trazadone. States this is an antidepressant. States she is a Engineer, civil (consulting). She does not want this Rx in her medical Records. Would like Xanax or Valium instead. Would like a CB to discuss.   CB [phone]

## 2019-11-01 MED ORDER — CLONAZEPAM 0.5 MG PO TABS: ORAL_TABLET | ORAL | 0 refills | Status: DC

## 2019-11-01 NOTE — Telephone Encounter (Signed)
I called and advised the patient of the temporary Rx and the OTC regimen message sent in MyChart - she said she will check the message.

## 2019-11-01 NOTE — Telephone Encounter (Signed)
Temporary Rx sent for klonopin to use sparingly.  Also try the OTC regimen I suggested through MyChart.

## 2019-11-01 NOTE — Telephone Encounter (Signed)
I called the patient and got more information on her request for medication. She does not want to take Trazodone because she "is not clinically depressed." Her husband was once on this medication and it "made him act weird" - he was depressed at that time. The patient complains of being stressed right now. 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 She has trouble calming her mind down at night so that she may go to sleep. She is asking for a low dose of xanax or diazepam to help with this. Please advise.

## 2019-11-01 NOTE — Addendum Note (Signed)
Addended by: Lillia Carmel on: 11/01/2019 05:08 PM   Modules accepted: Orders

## 2019-11-14 ENCOUNTER — Ambulatory Visit: Payer: BC Managed Care – PPO | Attending: Internal Medicine

## 2019-11-14 DIAGNOSIS — Z23 Encounter for immunization: Secondary | ICD-10-CM

## 2019-11-14 NOTE — Progress Notes (Signed)
   Covid-19 Vaccination [facility]  Name:  Laura Huff    MRN: [id number] DOB: 17-Mar-1956  11/14/2019  Laura Huff was observed post Covid-19 immunization for 30 minutes based on pre-vaccination screening without incident. She was provided with Vaccine Information Sheet and instruction to access the V-Safe system.   Laura Huff was instructed to call 911 with any severe reactions post vaccine: Marland Kitchen Difficulty breathing  . Swelling of face and throat  . A fast heartbeat  . A bad rash all over body  . Dizziness and weakness   Immunizations Administered    Name Date Dose VIS Date Route   Pfizer COVID-19 Vaccine 11/14/2019  2:52 PM 0.3 mL 07/22/2019 Intramuscular   Manufacturer: [employer], [employer]   Lot: [id number]   NDC: [address]-0

## 2019-12-24 IMAGING — MR MR LUMBAR SPINE W/O CM
4 of 5 series · 18 of 48 positions shown · non-contrast
Comparison: 01/08/2013

CLINICAL DATA: Low back pain with bilateral hip pain x 10 years +.
No change in bowels or bladder.

EXAM:
MRI LUMBAR SPINE WITHOUT CONTRAST
TECHNIQUE: Multiplanar, multisequence MR imaging of the lumbar spine was
performed. No intravenous contrast was administered.

[Series 6: T2 · sagittal · 4.0mm · 0.73mm/px · 6 of 15 slices shown (1 of 2)]
[im 1/15]
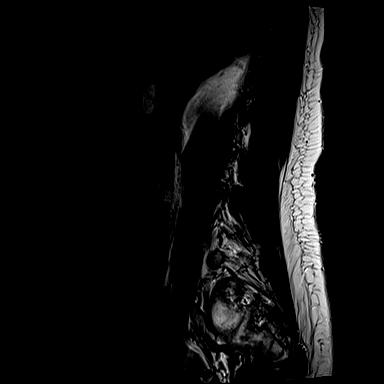
[im 3/15]
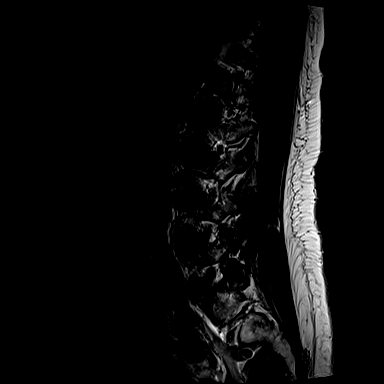
[im 6/15]
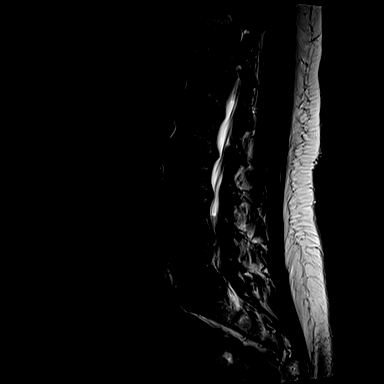
[im 9/15]
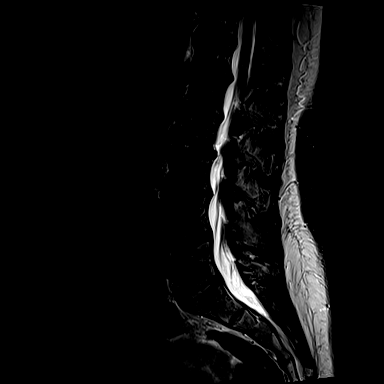
[im 12/15]
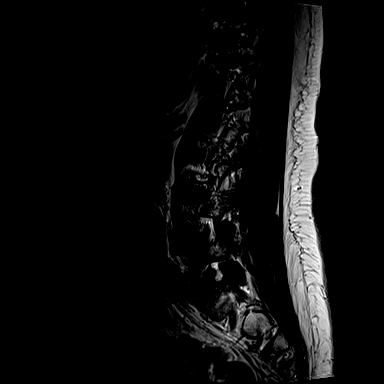
[im 15/15]
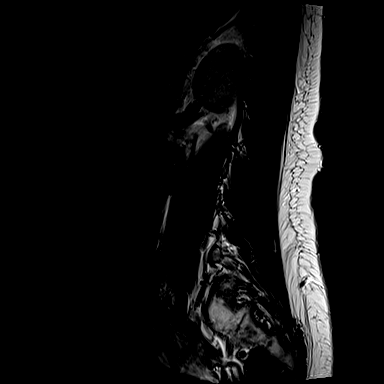

[Series 7: T1 · sagittal · 4.0mm · 0.73mm/px · 3 of 15 slices shown (1 of 2)]
[im 3/15]
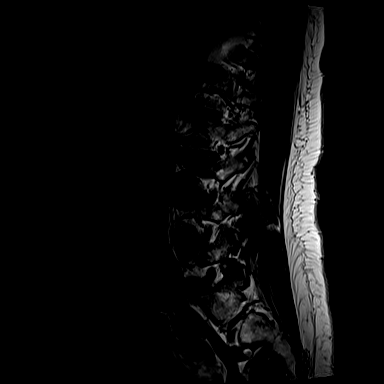
[im 9/15]
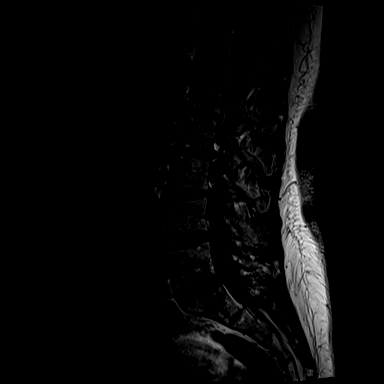
[im 15/15]
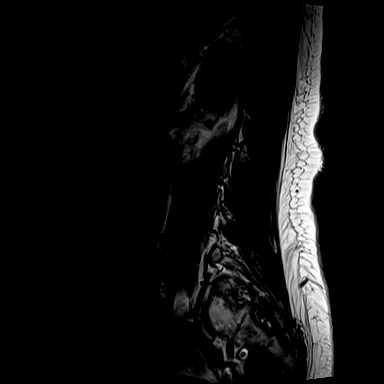

[Series 13: T2 · axial · 4.0mm · 0.28mm/px · z∈[-40,+135]mm · 6 of 39 slices shown (2 of 2)]
[im 1/39]
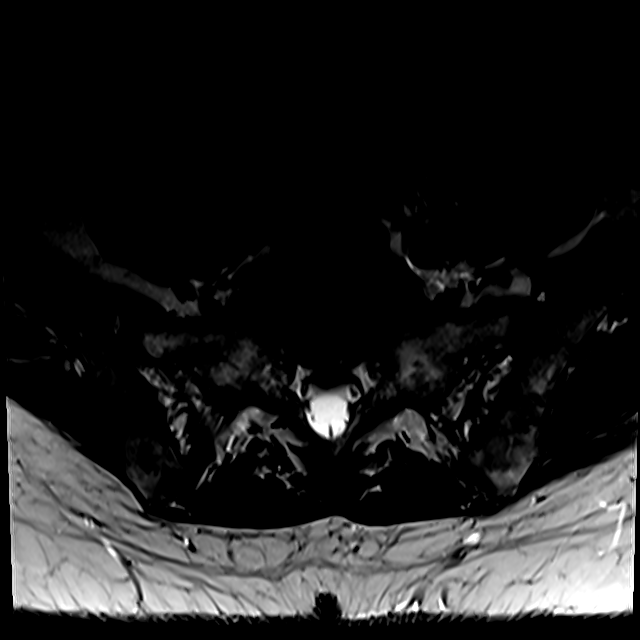
[im 6/39]
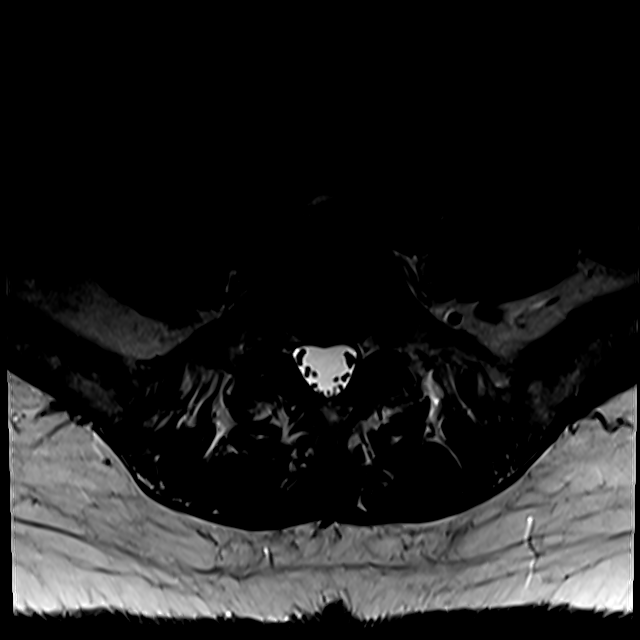
[im 11/39]
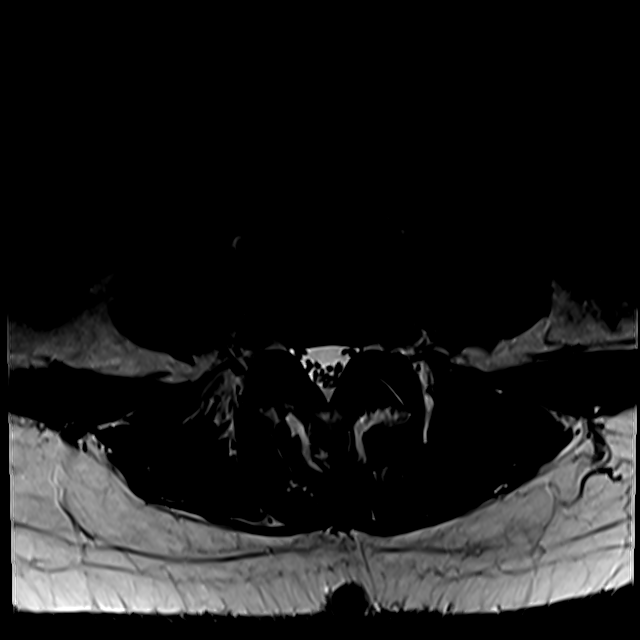
[im 17/39]
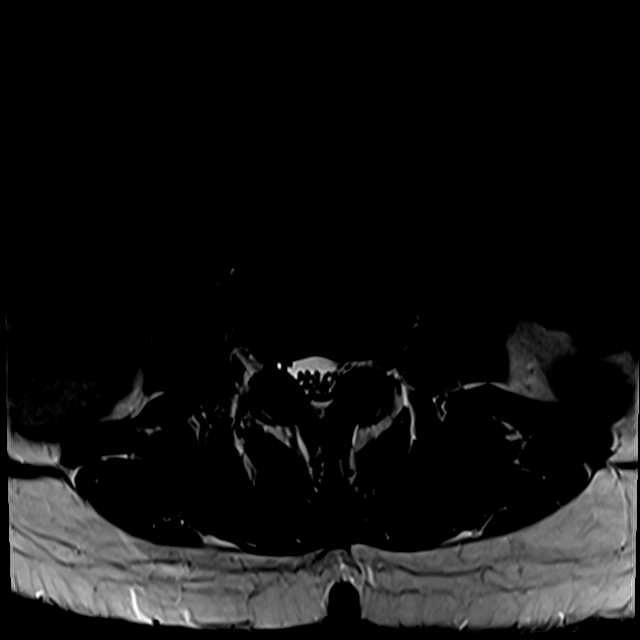
[im 20/39]
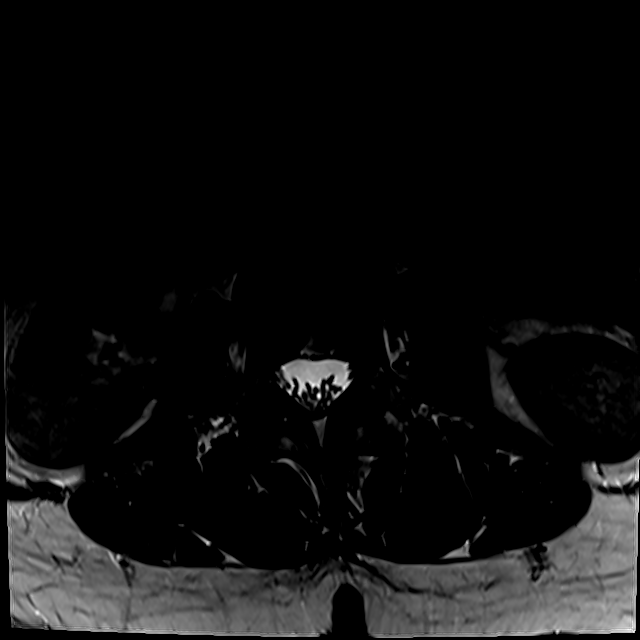
[im 33/39]
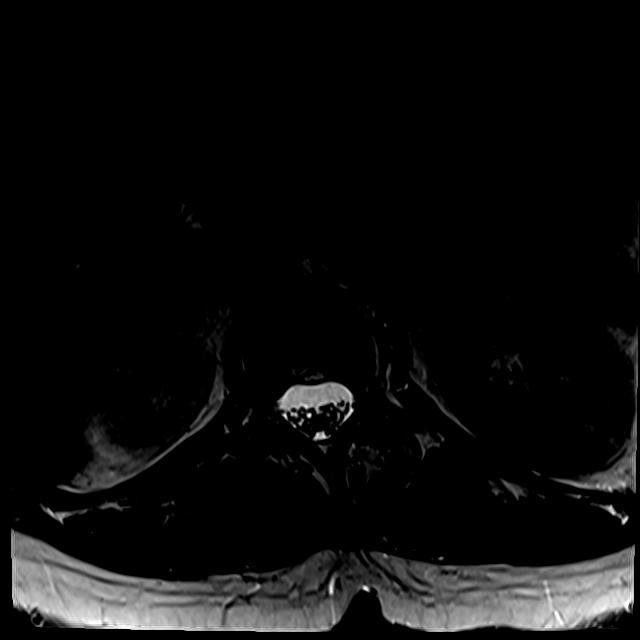

[Series 100: T1 · axial · 4.0mm · 0.28mm/px · z∈[-15,+135]mm · 3 of 39 slices shown (2 of 2)]
[im 6/39]
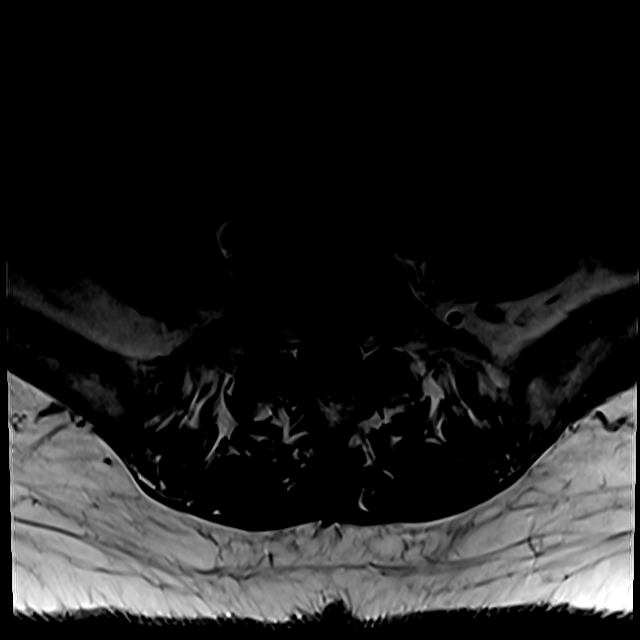
[im 20/39]
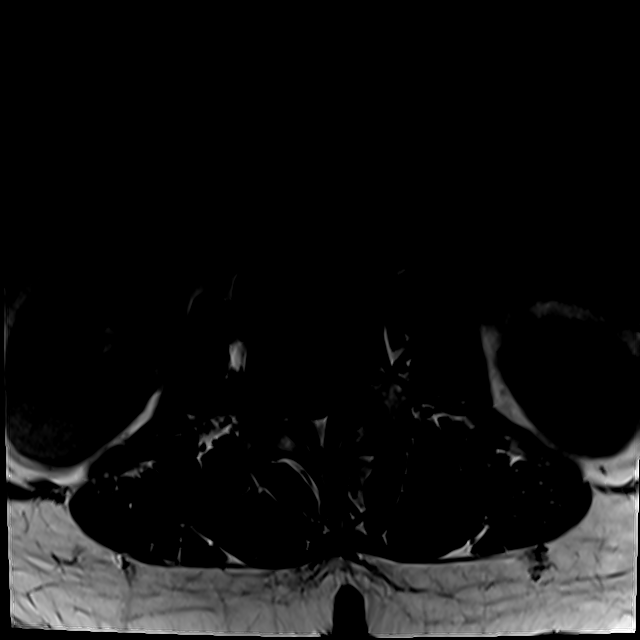
[im 33/39]
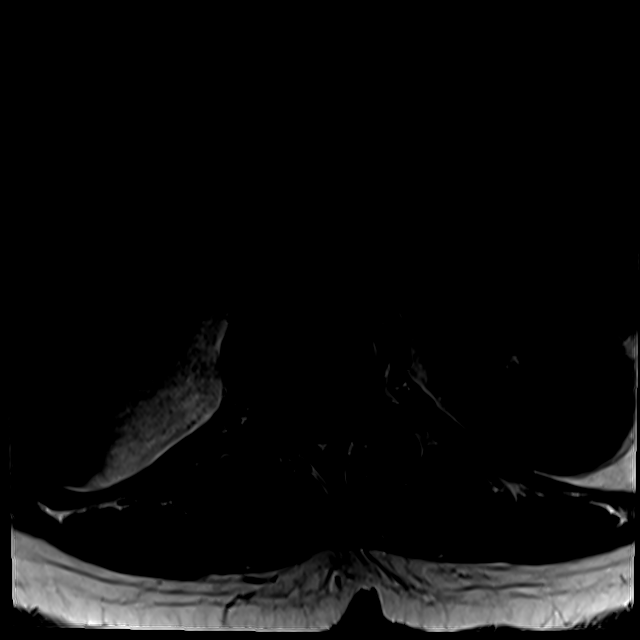

[18 of 48 positions shown; findings below may reference images not displayed]

FINDINGS: Segmentation:  Standard.

Alignment:  Physiologic.

Vertebrae:  No fracture, evidence of discitis, or bone lesion.

Conus medullaris and cauda equina: Conus extends to the L1 level.
Conus and cauda equina appear normal.

Paraspinal and other soft tissues: No acute paraspinal abnormality.

Disc levels:

Disc spaces: Degenerative disc disease with disc height loss at
T11-12, T12-L1 and L2-3.

T12-L1: Broad-based disc bulge with a tiny right paracentral disc
protrusion. Moderate right facet arthropathy. Moderate right
foraminal stenosis. No left foraminal stenosis. No central canal
stenosis.

L1-L2: Mild broad-based disc bulge. No evidence of neural foraminal
stenosis. No central canal stenosis.

L2-L3: Mild broad-based disc bulge with a right lateral disc
protrusion abutting the right extraforaminal L2 nerve root. Mild
left foraminal narrowing. No central canal stenosis.

L3-L4: Mild broad-based disc bulge with a tiny left paracentral disc
protrusion. No evidence of neural foraminal stenosis. No central
canal stenosis.

L4-L5: Minimal broad-based disc bulge. Mild left facet arthropathy.
No evidence of neural foraminal stenosis. No central canal stenosis.

L5-S1: Broad-based disc bulge. Mild bilateral facet arthropathy. No
evidence of neural foraminal stenosis. No central canal stenosis.
IMPRESSION: 1. Lumbar spine spondylosis as described above.
2. At L2-3 there is a mild broad-based disc bulge with a right
lateral disc protrusion abutting the right extraforaminal L2 nerve
root. Mild left foraminal narrowing.

## 2020-02-20 ENCOUNTER — Telehealth: Payer: Self-pay | Admitting: Adult Health

## 2020-02-20 NOTE — Telephone Encounter (Signed)
Patient called to inquire about the process for being entered into the lottery since receiving the COVID19 vaccine.  I attempted to call her back, but went straight to voice mail.  Unable to leave message as VM is full.    Lillard Anes, NP

## 2020-02-28 ENCOUNTER — Other Ambulatory Visit: Payer: Self-pay | Admitting: Family Medicine

## 2020-03-02 ENCOUNTER — Ambulatory Visit: Payer: Self-pay

## 2020-03-02 ENCOUNTER — Encounter: Payer: Self-pay | Admitting: Orthopaedic Surgery

## 2020-03-02 ENCOUNTER — Other Ambulatory Visit: Payer: Self-pay

## 2020-03-02 ENCOUNTER — Ambulatory Visit (INDEPENDENT_AMBULATORY_CARE_PROVIDER_SITE_OTHER): Payer: BC Managed Care – PPO | Admitting: Orthopaedic Surgery

## 2020-03-02 VITALS — Ht 64.0 in | Wt 140.0 lb

## 2020-03-02 DIAGNOSIS — M79674 Pain in right toe(s): Secondary | ICD-10-CM

## 2020-03-03 NOTE — Progress Notes (Signed)
Office Visit Note   Patient: Laura Huff           Date of Birth: 09-20-1955           MRN: [id number] Visit Date: 03/02/2020              Requested by: Laura Mesi, MD [address] [place],  [place] [address] PCP: Laura Mesi, MD   Assessment & Plan: Visit Diagnoses:  1. Great toe pain, right     Plan: Impression is right great toe hallux rigidus.  I reviewed the x-rays with the patient in detail.  Based on our discussion of options she will try more accommodative shoes as well as a more supportive sole with less flex.  She will also continue to take over-the-counter NSAIDs and Voltaren gel.  Follow-up as needed.  Follow-Up Instructions: Return if symptoms worsen or fail to improve.   Orders:  Orders Placed This Encounter  Procedures   XR Toe Great Right   No orders of the defined types were placed in this encounter.     Procedures: No procedures performed   Clinical Data: No additional findings.   Subjective: Chief Complaint  Patient presents with   Right Great Toe - Pain    [name] is a 64 year old female who comes in for 10 to 12days of right great toe pain.  She admits to wearing a pair shoes that she felt were too tight.  After that she had a lot of pain and swelling but fortunately the swelling has improved but she continues to have pain and decreased range of motion.  She takes oxycodone for chronic hip pain.  Denies any constitutional symptoms or injuries.   Review of Systems  Constitutional: Negative.   HENT: Negative.   Eyes: Negative.   Respiratory: Negative.   Cardiovascular: Negative.   Endocrine: Negative.   Musculoskeletal: Negative.   Neurological: Negative.   Hematological: Negative.   Psychiatric/Behavioral: Negative.   All other systems reviewed and are negative.    Objective: Vital Signs: Ht 5\' 4"  (1.626 m)    Wt 140 lb (63.5 kg)    BMI 24.03 kg/m   Physical Exam Vitals and nursing note reviewed.  Constitutional:       Appearance: She is well-developed.  Pulmonary:     Effort: Pulmonary effort is normal.  Skin:    General: Skin is warm.     Capillary Refill: Capillary refill takes less than 2 seconds.  Neurological:     Mental Status: She is alert and oriented to person, place, and time.  Psychiatric:        Behavior: Behavior normal.        Thought Content: Thought content normal.        Judgment: Judgment normal.     Ortho Exam Right great toe shows small palpable dorsal osteophyte.  There is pain with mid range of motion.  Positive grind.  No bunion deformity. Specialty Comments:  No specialty comments available.  Imaging: No results found.   PMFS History: There are no problems to display for this patient.  Past Medical History:  Diagnosis Date   Arthritis     Family History  Problem Relation Age of Onset   Cancer Mother        cervical cancer   Hypertension Father    Heart failure Father     Past Surgical History:  Procedure Laterality Date   CYST EXCISION Right 1985   thought it was a cyst but it  was a lymph gland-R neck   DILATION AND CURETTAGE OF UTERUS  2000   TUBAL LIGATION  2000   Social History   Occupational History   Not on file  Tobacco Use   Smoking status: Former Smoker    Types: Cigarettes    Quit date: 02/03/1986    Years since quitting: 34.1   Smokeless tobacco: Former Engineer, water and Sexual Activity   Alcohol use: Yes    Comment: 1-2x/month   Drug use: No   Sexual activity: Yes    Partners: Male

## 2020-05-21 ENCOUNTER — Ambulatory Visit: Payer: Self-pay | Admitting: Podiatry

## 2020-07-24 ENCOUNTER — Other Ambulatory Visit: Payer: Self-pay

## 2020-07-24 ENCOUNTER — Ambulatory Visit (INDEPENDENT_AMBULATORY_CARE_PROVIDER_SITE_OTHER): Payer: BC Managed Care – PPO | Admitting: Podiatry

## 2020-07-24 DIAGNOSIS — Z79899 Other long term (current) drug therapy: Secondary | ICD-10-CM

## 2020-07-24 DIAGNOSIS — B351 Tinea unguium: Secondary | ICD-10-CM | POA: Diagnosis not present

## 2020-07-24 NOTE — Patient Instructions (Signed)
Terbinafine oral granules What is this medicine? TERBINAFINE (TER bin a feen) is an antifungal medicine. It is used to treat certain kinds of fungal or yeast infections. This medicine may be used for other purposes; ask your health care provider or pharmacist if you have questions. COMMON BRAND NAME(S): Lamisil What should I tell my health care provider before I take this medicine? They need to know if you have any of these conditions:  drink alcoholic beverages  kidney disease  liver disease  an unusual or allergic reaction to Terbinafine, other medicines, foods, dyes, or preservatives  pregnant or trying to get pregnant  breast-feeding How should I use this medicine? Take this medicine by mouth. Follow the directions on the prescription label. Hold packet with cut line on top. Shake packet gently to settle contents. Tear packet open along cut line, or use scissors to cut across line. Carefully pour the entire contents of packet onto a spoonful of a soft food, such as pudding or other soft, non-acidic food such as mashed potatoes (do NOT use applesauce or a fruit-based food). If two packets are required for each dose, you may either sprinkle the content of both packets on one spoonful of non-acidic food, or sprinkle the contents of both packets on two spoonfuls of non-acidic food. Make sure that no granules remain in the packet. Swallow the mxiture of the food and granules without chewing. Take your medicine at regular intervals. Do not take it more often than directed. Take all of your medicine as directed even if you think you are better. Do not skip doses or stop your medicine early. Contact your pediatrician or health care professional regarding the use of this medicine in children. While this medicine may be prescribed for children as young as 4 years for selected conditions, precautions do apply. Overdosage: If you think you have taken too much of this medicine contact a poison control  center or emergency room at once. NOTE: This medicine is only for you. Do not share this medicine with others. What if I miss a dose? If you miss a dose, take it as soon as you can. If it is almost time for your next dose, take only that dose. Do not take double or extra doses. What may interact with this medicine? Do not take this medicine with any of the following medications:  thioridazine This medicine may also interact with the following medications:  beta-blockers  caffeine  cimetidine  cyclosporine  MAOIs like Carbex, Eldepryl, Marplan, Nardil, and Parnate  medicines for fungal infections like fluconazole and ketoconazole  medicines for irregular heartbeat like amiodarone, flecainide and propafenone  rifampin  SSRIs like citalopram, escitalopram, fluoxetine, fluvoxamine, paroxetine and sertraline  tricyclic antidepressants like amitriptyline, clomipramine, desipramine, imipramine, nortriptyline, and others  warfarin This list may not describe all possible interactions. Give your health care provider a list of all the medicines, herbs, non-prescription drugs, or dietary supplements you use. Also tell them if you smoke, drink alcohol, or use illegal drugs. Some items may interact with your medicine. What should I watch for while using this medicine? Your doctor may monitor your liver function. Tell your doctor right away if you have nausea or vomiting, loss of appetite, stomach pain on your right upper side, yellow skin, dark urine, light stools, or are over tired. This medicine may cause serious skin reactions. They can happen weeks to months after starting the medicine. Contact your health care provider right away if you notice fevers or flu-like symptoms   with a rash. The rash may be red or purple and then turn into blisters or peeling of the skin. Or, you might notice a red rash with swelling of the face, lips or lymph nodes in your neck or under your arms. You need to take  this medicine for 6 weeks or longer to cure the fungal infection. Take your medicine regularly for as long as your doctor or health care provider tells you to. What side effects may I notice from receiving this medicine? Side effects that you should report to your doctor or health care professional as soon as possible:  allergic reactions like skin rash or hives, swelling of the face, lips, or tongue  change in vision  dark urine  fever or infection  general ill feeling or flu-like symptoms  light-colored stools  loss of appetite, nausea  rash, fever, and swollen lymph nodes  redness, blistering, peeling or loosening of the skin, including inside the mouth  right upper belly pain  unusually weak or tired  yellowing of the eyes or skin Side effects that usually do not require medical attention (report to your doctor or health care professional if they continue or are bothersome):  changes in taste  diarrhea  hair loss  muscle or joint pain  stomach upset This list may not describe all possible side effects. Call your doctor for medical advice about side effects. You may report side effects to FDA at 1-800-FDA-1088. Where should I keep my medicine? Keep out of the reach of children. Store at room temperature between 15 and 30 degrees C (59 and 86 degrees F). Throw away any unused medicine after the expiration date. NOTE: This sheet is a summary. It may not cover all possible information. If you have questions about this medicine, talk to your doctor, pharmacist, or health care provider.  2020 Elsevier/Gold Standard (2018-11-05 15:35:11)  

## 2020-07-25 ENCOUNTER — Other Ambulatory Visit: Payer: Self-pay | Admitting: Podiatry

## 2020-07-25 LAB — COMPLETE METABOLIC PANEL WITH GFR
AG Ratio: 1.5 (calc) (ref 1.0–2.5)
ALT: 6 U/L (ref 6–29)
AST: 18 U/L (ref 10–35)
Albumin: 4 g/dL (ref 3.6–5.1)
Alkaline phosphatase (APISO): 48 U/L (ref 37–153)
BUN: 7 mg/dL (ref 7–25)
CO2: 30 mmol/L (ref 20–32)
Calcium: 9.2 mg/dL (ref 8.6–10.4)
Chloride: 101 mmol/L (ref 98–110)
Creat: 0.78 mg/dL (ref 0.50–0.99)
GFR, Est African American: 92 mL/min/{1.73_m2} (ref >=60)
GFR, Est Non African American: 79 mL/min/{1.73_m2} (ref >=60)
Globulin: 2.7 g/dL (calc) (ref 1.9–3.7)
Glucose, Bld: 111 mg/dL (ref 65–139)
Potassium: 3.8 mmol/L (ref 3.5–5.3)
Sodium: 139 mmol/L (ref 135–146)
Total Bilirubin: 0.2 mg/dL (ref 0.2–1.2)
Total Protein: 6.7 g/dL (ref 6.1–8.1)

## 2020-07-25 LAB — CBC WITH DIFFERENTIAL/PLATELET
Absolute Monocytes: 332 cells/uL (ref 200–950)
Basophils Absolute: 40 cells/uL (ref 0–200)
Basophils Relative: 1 %
Eosinophils Absolute: 60 cells/uL (ref 15–500)
Eosinophils Relative: 1.5 %
HCT: 35.6 % (ref 35.0–45.0)
Hemoglobin: 11.7 g/dL (ref 11.7–15.5)
Lymphs Abs: 1884 cells/uL (ref 850–3900)
MCH: 31 pg (ref 27.0–33.0)
MCHC: 32.9 g/dL (ref 32.0–36.0)
MCV: 94.4 fL (ref 80.0–100.0)
MPV: 12 fL (ref 7.5–12.5)
Monocytes Relative: 8.3 %
Neutro Abs: 1684 cells/uL (ref 1500–7800)
Neutrophils Relative %: 42.1 %
Platelets: 237 10*3/uL (ref 140–400)
RBC: 3.77 10*6/uL — ABNORMAL LOW (ref 3.80–5.10)
RDW: 11.7 % (ref 11.0–15.0)
Total Lymphocyte: 47.1 %
WBC: 4 10*3/uL (ref 3.8–10.8)

## 2020-07-25 MED ORDER — TERBINAFINE HCL 250 MG PO TABS: 250.0000 mg | ORAL_TABLET | Freq: Every day | ORAL | 0 refills | Status: AC

## 2020-07-26 NOTE — Progress Notes (Signed)
Subjective:   Patient ID: Laura Huff, female   DOB: 64 y.o.   MRN: [id number]   HPI 64 year old female presents the office today for concerns of toenail fungus mostly to her right big toe which is been ongoing for last 3 months.  She said the nail came about 2 months ago when she is not growing back in correctly as it is getting discolored and thick.  She does have discoloration to the other nails including darkened streak some multiple toenails.  She said they have not changed and been ongoing for several years.  She currently denies any redness or drainage or any swelling to the toenail sites.  She has no other concerns today.   Review of Systems  All other systems reviewed and are negative.  Past Medical History:  Diagnosis Date  . Arthritis     Past Surgical History:  Procedure Laterality Date  . CYST EXCISION Right 1985   thought it was a cyst but it was a lymph gland-R neck  . DILATION AND CURETTAGE OF UTERUS  2000  . TUBAL LIGATION  2000     Current Outpatient Medications:  .  aspirin-acetaminophen-caffeine (EXCEDRIN MIGRAINE) 250-250-65 MG tablet, Take 2 tablets by mouth every 6 (six) hours as needed for headache., Disp: , Rfl:  .  bisacodyl (DULCOLAX) 5 MG EC tablet, Take 5 mg by mouth daily as needed for moderate constipation., Disp: , Rfl:  .  clonazePAM (KLONOPIN) 0.5 MG tablet, TAKE 1/2- 1 TABLET BY MOUTH EVERY NIGHT AT BEDTIME, Disp: 20 tablet, Rfl: 0 .  diazepam (VALIUM) 5 MG tablet, 1 PO 1 hour before MRI, repeat prn, Disp: 5 tablet, Rfl: 0 .  EPINEPHrine 0.3 mg/0.3 mL IJ SOAJ injection, Inject 0.3 mLs (0.3 mg total) into the muscle as needed for anaphylaxis., Disp: 1 each, Rfl: 1 .  fluticasone (FLONASE) 50 MCG/ACT nasal spray, Place 1 spray into both nostrils daily., Disp: , Rfl:  .  omeprazole (PRILOSEC) 20 MG capsule, Take 20 mg by mouth daily., Disp: , Rfl:  .  Oxycodone HCl 10 MG TABS, Take 10 mg by mouth every 4 (four) hours as needed for pain., Disp: , Rfl:  0 .  terbinafine (LAMISIL) 250 MG tablet, Take 1 tablet (250 mg total) by mouth daily., Disp: 90 tablet, Rfl: 0 .  traZODone (DESYREL) 50 MG tablet, 1-2 PO q HS prn insomnia, Disp: 60 tablet, Rfl: 3  Allergies  Allergen Reactions  . Shellfish Allergy Anaphylaxis    All seafood          Objective:  Physical Exam  General: AAO x3, NAD  Dermatological: Right hallux nails hypertrophic, dystrophic with brown discoloration.  No pain in the nails there is no redness or drainage or signs of infection.  Other nails in general also are thickened and discolored.  There are multiple nails with longitudinal melanonychia but there is no hyperpigmentation of the surrounding skin.  There is no pain in the nails there is no redness or drainage or any signs of infection.  Vascular: Dorsalis Pedis artery and Posterior Tibial artery pedal pulses are 2/4 bilateral with immedate capillary fill time. There is no pain with calf compression, swelling, warmth, erythema.   Neruologic: Grossly intact via light touch bilateral.   Musculoskeletal: No gross boney pedal deformities bilateral. No pain, crepitus, or limitation noted with foot and ankle range of motion bilateral. Muscular strength 5/5 in all groups tested bilateral.  Gait: Unassisted, Nonantalgic.       Assessment:  Onychomycosis    Plan:  -Treatment options discussed including all alternatives, risks, and complications -Etiology of symptoms were discussed -After discussion regards options she also received oral treatment.  We will likely do Lamisil.  We will check CBC and LFT prior to starting the medication. -We will monitor the longitudinal melanonychia but seems to be stable has been a chronic issue without any change.  Vivi Barrack DPM

## 2020-07-31 ENCOUNTER — Encounter: Payer: Self-pay | Admitting: Podiatry

## 2020-08-09 ENCOUNTER — Other Ambulatory Visit: Payer: Self-pay | Admitting: Family Medicine

## 2020-09-10 ENCOUNTER — Ambulatory Visit (INDEPENDENT_AMBULATORY_CARE_PROVIDER_SITE_OTHER): Payer: BC Managed Care – PPO | Admitting: Podiatry

## 2020-09-10 ENCOUNTER — Other Ambulatory Visit: Payer: Self-pay

## 2020-09-10 DIAGNOSIS — Z79899 Other long term (current) drug therapy: Secondary | ICD-10-CM | POA: Diagnosis not present

## 2020-09-10 DIAGNOSIS — B351 Tinea unguium: Secondary | ICD-10-CM

## 2020-09-10 NOTE — Progress Notes (Signed)
Subjective: 65 year old female presents the office today for follow evaluation of onychomycosis, currently on Lamisil.  She states that she is tolerating medication without any side effects and she is already seeing improvement in the toenails.  She denies any redness or drainage of the nail denies any pain. Denies any systemic complaints such as fevers, chills, nausea, vomiting. No acute changes since last appointment, and no other complaints at this time.   Objective: AAO x3, NAD DP/PT pulses palpable bilaterally, CRT less than 3 seconds Overall the right hallux toenail is growing out but overall looking better.  There is clearing on the proximal aspect.  There is no pain in the nail there is no redness.  There is signs of infection.  No open lesions.  No pain with calf compression, swelling, warmth, erythema  Assessment: Onychomycosis, currently on Lamisil  Plan: -All treatment options discussed with the patient including all alternatives, risks, complications.  -She is tolerating medication well.  We will recheck a CBC and LFT.  We will finish the course but if needed we can extend for additional 30 days but will recheck CBC and LFT prior to this.  If we discontinue the Lamisil the end of the 90 days discussed topical medication if needed.  Continue to monitor any side effects. -Patient encouraged to call the office with any questions, concerns, change in symptoms.   Vivi Barrack DPM

## 2020-09-15 LAB — CBC WITH DIFFERENTIAL/PLATELET
Absolute Monocytes: 310 cells/uL (ref 200–950)
Basophils Absolute: 39 cells/uL (ref 0–200)
Basophils Relative: 0.9 %
Eosinophils Absolute: 60 cells/uL (ref 15–500)
Eosinophils Relative: 1.4 %
HCT: 36.7 % (ref 35.0–45.0)
Hemoglobin: 12.2 g/dL (ref 11.7–15.5)
Lymphs Abs: 2094 cells/uL (ref 850–3900)
MCH: 31.5 pg (ref 27.0–33.0)
MCHC: 33.2 g/dL (ref 32.0–36.0)
MCV: 94.8 fL (ref 80.0–100.0)
MPV: 12 fL (ref 7.5–12.5)
Monocytes Relative: 7.2 %
Neutro Abs: 1797 cells/uL (ref 1500–7800)
Neutrophils Relative %: 41.8 %
Platelets: 244 10*3/uL (ref 140–400)
RBC: 3.87 10*6/uL (ref 3.80–5.10)
RDW: 12.3 % (ref 11.0–15.0)
Total Lymphocyte: 48.7 %
WBC: 4.3 10*3/uL (ref 3.8–10.8)

## 2020-09-15 LAB — HEPATIC FUNCTION PANEL
AG Ratio: 1.5 (calc) (ref 1.0–2.5)
ALT: 8 U/L (ref 6–29)
AST: 19 U/L (ref 10–35)
Albumin: 4.3 g/dL (ref 3.6–5.1)
Alkaline phosphatase (APISO): 57 U/L (ref 37–153)
Bilirubin, Direct: 0.1 mg/dL (ref 0.0–0.2)
Globulin: 2.9 g/dL (calc) (ref 1.9–3.7)
Indirect Bilirubin: 0.2 mg/dL (calc) (ref 0.2–1.2)
Total Bilirubin: 0.3 mg/dL (ref 0.2–1.2)
Total Protein: 7.2 g/dL (ref 6.1–8.1)

## 2020-09-17 ENCOUNTER — Telehealth: Payer: Self-pay | Admitting: *Deleted

## 2020-09-17 NOTE — Telephone Encounter (Signed)
-----   Message from Vivi Barrack, DPM sent at 09/17/2020  9:35 AM EST ----- Misty Stanley- please let her know that the blood work is normal.

## 2020-09-17 NOTE — Telephone Encounter (Signed)
Called and spoke with the patient and relayed the message per Dr Wagoner. Laura Huff 

## 2020-10-15 ENCOUNTER — Ambulatory Visit (INDEPENDENT_AMBULATORY_CARE_PROVIDER_SITE_OTHER): Payer: BC Managed Care – PPO | Admitting: Podiatry

## 2020-10-15 ENCOUNTER — Other Ambulatory Visit: Payer: Self-pay

## 2020-10-15 DIAGNOSIS — B351 Tinea unguium: Secondary | ICD-10-CM

## 2020-10-15 DIAGNOSIS — Z79899 Other long term (current) drug therapy: Secondary | ICD-10-CM | POA: Diagnosis not present

## 2020-10-15 MED ORDER — TERBINAFINE HCL 250 MG PO TABS: 250.0000 mg | ORAL_TABLET | Freq: Every day | ORAL | 0 refills | Status: AC

## 2020-10-15 NOTE — Patient Instructions (Signed)
Terbinafine oral granules What is this medicine? TERBINAFINE (TER bin a feen) is an antifungal medicine. It is used to treat certain kinds of fungal or yeast infections. This medicine may be used for other purposes; ask your health care provider or pharmacist if you have questions. COMMON BRAND NAME(S): Lamisil What should I tell my health care provider before I take this medicine? They need to know if you have any of these conditions:  drink alcoholic beverages  kidney disease  liver disease  an unusual or allergic reaction to Terbinafine, other medicines, foods, dyes, or preservatives  pregnant or trying to get pregnant  breast-feeding How should I use this medicine? Take this medicine by mouth. Follow the directions on the prescription label. Hold packet with cut line on top. Shake packet gently to settle contents. Tear packet open along cut line, or use scissors to cut across line. Carefully pour the entire contents of packet onto a spoonful of a soft food, such as pudding or other soft, non-acidic food such as mashed potatoes (do NOT use applesauce or a fruit-based food). If two packets are required for each dose, you may either sprinkle the content of both packets on one spoonful of non-acidic food, or sprinkle the contents of both packets on two spoonfuls of non-acidic food. Make sure that no granules remain in the packet. Swallow the mxiture of the food and granules without chewing. Take your medicine at regular intervals. Do not take it more often than directed. Take all of your medicine as directed even if you think you are better. Do not skip doses or stop your medicine early. Contact your pediatrician or health care professional regarding the use of this medicine in children. While this medicine may be prescribed for children as young as 4 years for selected conditions, precautions do apply. Overdosage: If you think you have taken too much of this medicine contact a poison control  center or emergency room at once. NOTE: This medicine is only for you. Do not share this medicine with others. What if I miss a dose? If you miss a dose, take it as soon as you can. If it is almost time for your next dose, take only that dose. Do not take double or extra doses. What may interact with this medicine? Do not take this medicine with any of the following medications:  thioridazine This medicine may also interact with the following medications:  beta-blockers  caffeine  cimetidine  cyclosporine  MAOIs like Carbex, Eldepryl, Marplan, Nardil, and Parnate  medicines for fungal infections like fluconazole and ketoconazole  medicines for irregular heartbeat like amiodarone, flecainide and propafenone  rifampin  SSRIs like citalopram, escitalopram, fluoxetine, fluvoxamine, paroxetine and sertraline  tricyclic antidepressants like amitriptyline, clomipramine, desipramine, imipramine, nortriptyline, and others  warfarin This list may not describe all possible interactions. Give your health care provider a list of all the medicines, herbs, non-prescription drugs, or dietary supplements you use. Also tell them if you smoke, drink alcohol, or use illegal drugs. Some items may interact with your medicine. What should I watch for while using this medicine? Your doctor may monitor your liver function. Tell your doctor right away if you have nausea or vomiting, loss of appetite, stomach pain on your right upper side, yellow skin, dark urine, light stools, or are over tired. This medicine may cause serious skin reactions. They can happen weeks to months after starting the medicine. Contact your health care provider right away if you notice fevers or flu-like symptoms   with a rash. The rash may be red or purple and then turn into blisters or peeling of the skin. Or, you might notice a red rash with swelling of the face, lips or lymph nodes in your neck or under your arms. You need to take  this medicine for 6 weeks or longer to cure the fungal infection. Take your medicine regularly for as long as your doctor or health care provider tells you to. What side effects may I notice from receiving this medicine? Side effects that you should report to your doctor or health care professional as soon as possible:  allergic reactions like skin rash or hives, swelling of the face, lips, or tongue  change in vision  dark urine  fever or infection  general ill feeling or flu-like symptoms  light-colored stools  loss of appetite, nausea  rash, fever, and swollen lymph nodes  redness, blistering, peeling or loosening of the skin, including inside the mouth  right upper belly pain  unusually weak or tired  yellowing of the eyes or skin Side effects that usually do not require medical attention (report to your doctor or health care professional if they continue or are bothersome):  changes in taste  diarrhea  hair loss  muscle or joint pain  stomach upset This list may not describe all possible side effects. Call your doctor for medical advice about side effects. You may report side effects to FDA at 1-800-FDA-1088. Where should I keep my medicine? Keep out of the reach of children. Store at room temperature between 15 and 30 degrees C (59 and 86 degrees F). Throw away any unused medicine after the expiration date. NOTE: This sheet is a summary. It may not cover all possible information. If you have questions about this medicine, talk to your doctor, pharmacist, or health care provider.  2021 Elsevier/Gold Standard (2018-11-05 15:35:11)  

## 2020-10-19 NOTE — Progress Notes (Signed)
Subjective: 65 year old female presents the office today for follow evaluation of onychomycosis, currently on Lamisil.  She said that she is doing much better and the Lamisil has been helping.  She has noticed much improvement to the toenail but still some discoloration.  She is interested in continue the medication for an additional 30 days.  She said no side effects. Denies any systemic complaints such as fevers, chills, nausea, vomiting. No acute changes since last appointment, and no other complaints at this time.   Objective: AAO x3, NAD DP/PT pulses palpable bilaterally, CRT less than 3 seconds Overall the right hallux toenail is growing out but overall the color has improved further still yellow discoloration present the nail.  There is a light hyperpigmented streak within the nail but this is also present other toenails and likely benign finding.  There is no surrounding hyperpigmentation.  There is clearing on the proximal aspect.  There is no pain in the nail there is no redness.  There is signs of infection.  No open lesions.  No pain with calf compression, swelling, warmth, erythema  Assessment: Onychomycosis, currently on Lamisil-tolerating well  Plan: -All treatment options discussed with the patient including all alternatives, risks, complications.  -She is tolerating medication well.  We will recheck a CBC and LFT.  We will do 30 more days of Lamisil.  Continue to monitor for any side effects. -Patient encouraged to call the office with any questions, concerns, change in symptoms.   Vivi Barrack DPM

## 2020-10-27 ENCOUNTER — Other Ambulatory Visit: Payer: Self-pay | Admitting: Family Medicine

## 2020-11-26 ENCOUNTER — Other Ambulatory Visit: Payer: Self-pay | Admitting: Podiatry

## 2020-12-21 ENCOUNTER — Encounter: Payer: Self-pay | Admitting: Family Medicine

## 2020-12-21 ENCOUNTER — Ambulatory Visit (INDEPENDENT_AMBULATORY_CARE_PROVIDER_SITE_OTHER): Payer: BC Managed Care – PPO | Admitting: Family Medicine

## 2020-12-21 ENCOUNTER — Other Ambulatory Visit: Payer: Self-pay

## 2020-12-21 ENCOUNTER — Ambulatory Visit: Payer: Self-pay

## 2020-12-21 DIAGNOSIS — M542 Cervicalgia: Secondary | ICD-10-CM | POA: Diagnosis not present

## 2020-12-21 MED ORDER — BACLOFEN 10 MG PO TABS: 5.0000 mg | ORAL_TABLET | Freq: Three times a day (TID) | ORAL | 3 refills | Status: AC | PRN

## 2020-12-21 MED ORDER — METHYLPREDNISOLONE 4 MG PO TBPK: ORAL_TABLET | ORAL | 0 refills | Status: AC

## 2020-12-21 NOTE — Progress Notes (Signed)
   Office Visit Note   Patient: Laura Huff           Date of Birth: 10/17/1955           MRN: [id number] Visit Date: 12/21/2020 Requested by: Lavada Mesi, MD [address] [place],  [place] [address] PCP: Lavada Mesi, MD  Subjective: Chief Complaint  Patient presents with  . Neck - Pain    3 days ago, while standing in class, she felt a sharp pain in the right side of her neck. Shooting. Happened once again. Has had tingling in the fingers of her hand since then, with numbness in the right arm. NKI. Right-hand dominant.     HPI: She is here with neck and right arm pain.  Symptoms started 3 days ago while standing in class, sudden onset of sharp pain and then tingling sensation into her arm and fingers.  At first symptoms were intermittent, now it is more constant.  She was concerned about the possibility of a stroke.  She has never had a stroke, she does not have hypertension, she is a non-smoker and does not have diabetes.               ROS:   All other systems were reviewed and are negative.  Objective: Vital Signs: There were no vitals taken for this visit.  Physical Exam:  General:  Alert and oriented, in no acute distress. Pulm:  Breathing unlabored. Psy:  Normal mood, congruent affect. Skin: No rash Neck: She has tender trigger points to the right of midline at the C3-4 and C4-5 levels.  Good range of motion of the neck, Spurling's test is equivocal.  Upper extremity strength and reflexes are normal.  2+ carotid pulses with no audible bruits.  Heart rate is irregular and she has no murmurs today.  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  No acute compression fracture, no sign of neoplasm.   Assessment & Plan: 1.  Neck and right arm pain, suspect cervical radiculopathy. -We will try a Medrol Dosepak, baclofen as needed,  and physical therapy referral.  If symptoms persist, MRI scan.     Procedures: No procedures performed        PMFS History: There are no problems to display for this patient.  Past Medical History:  Diagnosis Date  . Arthritis     Family History  Problem Relation Age of Onset  . Cancer Mother        cervical cancer  . Hypertension Father   . Heart failure Father     Past Surgical History:  Procedure Laterality Date  . CYST EXCISION Right 1985   thought it was a cyst but it was a lymph gland-R neck  . DILATION AND CURETTAGE OF UTERUS  2000  . TUBAL LIGATION  2000   Social History   Occupational History  . Not on file  Tobacco Use  . Smoking status: Former Smoker    Types: Cigarettes    Quit date: 02/03/1986    Years since quitting: 34.9  . Smokeless tobacco: Former Engineer, water and Sexual Activity  . Alcohol use: Yes    Comment: 1-2x/month  . Drug use: No  . Sexual activity: Yes    Partners: Male

## 2021-02-06 ENCOUNTER — Other Ambulatory Visit: Payer: Self-pay | Admitting: Family Medicine

## 2021-04-17 ENCOUNTER — Other Ambulatory Visit: Payer: Self-pay | Admitting: Orthopaedic Surgery

## 2021-04-17 ENCOUNTER — Telehealth: Payer: Self-pay

## 2021-04-17 MED ORDER — CLONAZEPAM 0.5 MG PO TABS: ORAL_TABLET | ORAL | 0 refills | Status: DC

## 2021-04-17 NOTE — Telephone Encounter (Signed)
Refill request clonazepam  ([facility] patient)

## 2021-04-19 ENCOUNTER — Other Ambulatory Visit: Payer: Self-pay | Admitting: Surgical

## 2021-04-19 ENCOUNTER — Telehealth: Payer: Self-pay

## 2021-04-19 MED ORDER — CLONAZEPAM 0.5 MG PO TABS: ORAL_TABLET | ORAL | 0 refills | Status: DC

## 2021-04-19 NOTE — Telephone Encounter (Signed)
Request refill of clonazepam 0.5mg  ([facility] patient) Can you advise?

## 2021-04-19 NOTE — Telephone Encounter (Signed)
Tried calling pt to advise but line was busy

## 2021-04-19 NOTE — Telephone Encounter (Signed)
Okay for one time refill but needs PCP to prescribe in future so needs new PCP or to re-establish with Dr. Prince Rome at new practice

## 2021-04-22 NOTE — Telephone Encounter (Signed)
Phone number busy.

## 2021-04-23 ENCOUNTER — Other Ambulatory Visit: Payer: Self-pay

## 2021-04-23 MED ORDER — CLONAZEPAM 0.5 MG PO TABS: ORAL_TABLET | ORAL | 0 refills | Status: AC

## 2021-11-21 ENCOUNTER — Other Ambulatory Visit: Payer: Self-pay

## 2021-11-21 ENCOUNTER — Ambulatory Visit
Admission: RE | Admit: 2021-11-21 | Discharge: 2021-11-21 | Disposition: A | Discharge status: Home / Self Care | Payer: BC Managed Care – PPO | Source: Ambulatory Visit | Attending: Pain Medicine | Admitting: Pain Medicine

## 2021-11-21 DIAGNOSIS — R52 Pain, unspecified: Secondary | ICD-10-CM

## 2022-04-30 ENCOUNTER — Ambulatory Visit
Admission: RE | Admit: 2022-04-30 | Discharge: 2022-04-30 | Disposition: A | Discharge status: Home / Self Care | Payer: BC Managed Care – PPO | Source: Ambulatory Visit | Attending: Pain Medicine | Admitting: Pain Medicine

## 2022-04-30 ENCOUNTER — Other Ambulatory Visit: Payer: Self-pay | Admitting: Pain Medicine

## 2022-04-30 DIAGNOSIS — M5136 Other intervertebral disc degeneration, lumbar region: Secondary | ICD-10-CM

## 2022-05-09 ENCOUNTER — Other Ambulatory Visit: Payer: Self-pay | Admitting: Pain Medicine

## 2022-05-09 DIAGNOSIS — M5416 Radiculopathy, lumbar region: Secondary | ICD-10-CM

## 2022-07-24 ENCOUNTER — Other Ambulatory Visit: Payer: BC Managed Care – PPO

## 2022-07-25 ENCOUNTER — Other Ambulatory Visit: Payer: Self-pay | Admitting: Pain Medicine

## 2022-07-25 DIAGNOSIS — M5416 Radiculopathy, lumbar region: Secondary | ICD-10-CM

## 2022-07-29 ENCOUNTER — Ambulatory Visit
Admission: RE | Admit: 2022-07-29 | Discharge: 2022-07-29 | Disposition: A | Discharge status: Home / Self Care | Payer: BC Managed Care – PPO | Source: Ambulatory Visit | Attending: Pain Medicine | Admitting: Pain Medicine

## 2022-07-29 DIAGNOSIS — M5416 Radiculopathy, lumbar region: Secondary | ICD-10-CM

## 2023-08-09 ENCOUNTER — Emergency Department (HOSPITAL_COMMUNITY)
Admission: EM | Admit: 2023-08-09 | Discharge: 2023-08-10 | Discharge status: Against Medical Advice | Payer: BC Managed Care – PPO | Attending: Medical | Admitting: Medical

## 2023-08-09 ENCOUNTER — Encounter (HOSPITAL_COMMUNITY): Payer: Self-pay | Admitting: Emergency Medicine

## 2023-08-09 ENCOUNTER — Other Ambulatory Visit: Payer: Self-pay

## 2023-08-09 DIAGNOSIS — Z5321 Procedure and treatment not carried out due to patient leaving prior to being seen by health care provider: Secondary | ICD-10-CM | POA: Diagnosis not present

## 2023-08-09 DIAGNOSIS — H1132 Conjunctival hemorrhage, left eye: Secondary | ICD-10-CM | POA: Diagnosis present

## 2023-08-09 LAB — CBC WITH DIFFERENTIAL/PLATELET
Abs Immature Granulocytes: 0.01 10*3/uL (ref 0.00–0.07)
Basophils Absolute: 0.1 10*3/uL (ref 0.0–0.1)
Basophils Relative: 1 %
Eosinophils Absolute: 0.1 10*3/uL (ref 0.0–0.5)
Eosinophils Relative: 3 %
HCT: 36.3 % (ref 36.0–46.0)
Hemoglobin: 11.7 g/dL — ABNORMAL LOW (ref 12.0–15.0)
Immature Granulocytes: 0 %
Lymphocytes Relative: 38 %
Lymphs Abs: 1.4 10*3/uL (ref 0.7–4.0)
MCH: 32.3 pg (ref 26.0–34.0)
MCHC: 32.2 g/dL (ref 30.0–36.0)
MCV: 100.3 fL — ABNORMAL HIGH (ref 80.0–100.0)
Monocytes Absolute: 0.4 10*3/uL (ref 0.1–1.0)
Monocytes Relative: 11 %
Neutro Abs: 1.7 10*3/uL (ref 1.7–7.7)
Neutrophils Relative %: 47 %
Platelets: 198 10*3/uL (ref 150–400)
RBC: 3.62 MIL/uL — ABNORMAL LOW (ref 3.87–5.11)
RDW: 12.1 % (ref 11.5–15.5)
WBC: 3.6 10*3/uL — ABNORMAL LOW (ref 4.0–10.5)
nRBC: 0 % (ref 0.0–0.2)

## 2023-08-09 LAB — BASIC METABOLIC PANEL
Anion gap: 6 (ref 5–15)
BUN: 6 mg/dL — ABNORMAL LOW (ref 8–23)
CO2: 30 mmol/L (ref 22–32)
Calcium: 9.3 mg/dL (ref 8.9–10.3)
Chloride: 103 mmol/L (ref 98–111)
Creatinine, Ser: 0.89 mg/dL (ref 0.44–1.00)
GFR, Estimated: >60 mL/min (ref >60)
Glucose, Bld: 90 mg/dL (ref 70–99)
Potassium: 4 mmol/L (ref 3.5–5.1)
Sodium: 139 mmol/L (ref 135–145)

## 2023-08-09 NOTE — ED Triage Notes (Signed)
Pt presents with redness to left eye.  States she thinks there is bleeding behind her eye. No hx of hypertension or not on blood thinners. No headache or vision changes.

## 2023-08-09 NOTE — ED Provider Triage Note (Cosign Needed)
Emergency Medicine Provider Triage Evaluation Note  Laura Huff , a 67 y.o. female  was evaluated in triage.  Pt complains of capillaries breaking behind eyex1 day. Is not sure if it happened after singing karaoke last night. Denies any injury, headache, or vision changes. Review of Systems  Positive: Eye issue Negative: Eye pain  Physical Exam  BP (!) 146/89 (BP Location: Right Arm)   Pulse 91   Temp 98.7 F (37.1 C) (Oral)   Resp 16   SpO2 100%  Gen:   Awake, no distress   Resp:  Normal effort  MSK:   Moves extremities without difficulty  Other:  +conjunctival hemorrhage in left lower eye  Medical Decision Making  Medically screening exam initiated at 4:13 PM.  Appropriate orders placed.  Laura Huff was informed that the remainder of the evaluation will be completed by another provider, this initial triage assessment does not replace that evaluation, and the importance of remaining in the ED until their evaluation is complete.     Pete Pelt, [place] 08/09/23 1615

## 2023-08-10 NOTE — ED Notes (Signed)
Pt was called x3 no answer
